# Patient Record
Sex: Male | Born: 1960 | Race: White | Hispanic: No | Marital: Married | Smoking: Never smoker
Health system: Southern US, Community
[De-identification: ages and names within clinical notes are randomized; demographics above are authoritative.]

## PROBLEM LIST (undated history)

## (undated) DIAGNOSIS — I10 Essential (primary) hypertension: Secondary | ICD-10-CM

## (undated) DIAGNOSIS — I493 Ventricular premature depolarization: Secondary | ICD-10-CM

## (undated) DIAGNOSIS — I471 Supraventricular tachycardia: Secondary | ICD-10-CM

## (undated) HISTORY — PX: NO PAST SURGERIES: SHX2092

## (undated) HISTORY — DX: Ventricular premature depolarization: I49.3

## (undated) HISTORY — DX: Supraventricular tachycardia: I47.1

## (undated) HISTORY — PX: KNEE CARTILAGE SURGERY: SHX688

---

## 2007-04-03 ENCOUNTER — Ambulatory Visit (HOSPITAL_COMMUNITY): Admission: RE | Admit: 2007-04-03 | Discharge: 2007-04-03 | Payer: Self-pay | Admitting: Chiropractic Medicine

## 2009-08-20 DEATH — deceased

## 2016-06-18 ENCOUNTER — Ambulatory Visit (HOSPITAL_COMMUNITY): Payer: BC Managed Care – PPO

## 2016-06-18 ENCOUNTER — Encounter (HOSPITAL_COMMUNITY): Payer: Self-pay | Admitting: *Deleted

## 2016-06-18 ENCOUNTER — Ambulatory Visit (HOSPITAL_COMMUNITY)
Admission: EM | Admit: 2016-06-18 | Discharge: 2016-06-18 | Disposition: A | Discharge status: Home / Self Care | Payer: BC Managed Care – PPO | Attending: Emergency Medicine | Admitting: Emergency Medicine

## 2016-06-18 DIAGNOSIS — M25562 Pain in left knee: Secondary | ICD-10-CM

## 2016-06-18 DIAGNOSIS — W19XXXA Unspecified fall, initial encounter: Secondary | ICD-10-CM | POA: Diagnosis not present

## 2016-06-18 DIAGNOSIS — M7989 Other specified soft tissue disorders: Secondary | ICD-10-CM | POA: Diagnosis not present

## 2016-06-18 DIAGNOSIS — M170 Bilateral primary osteoarthritis of knee: Secondary | ICD-10-CM | POA: Diagnosis not present

## 2016-06-18 DIAGNOSIS — Z79899 Other long term (current) drug therapy: Secondary | ICD-10-CM | POA: Diagnosis not present

## 2016-06-18 DIAGNOSIS — Z7982 Long term (current) use of aspirin: Secondary | ICD-10-CM | POA: Insufficient documentation

## 2016-06-18 DIAGNOSIS — M25462 Effusion, left knee: Secondary | ICD-10-CM | POA: Diagnosis not present

## 2016-06-18 DIAGNOSIS — I10 Essential (primary) hypertension: Secondary | ICD-10-CM | POA: Insufficient documentation

## 2016-06-18 HISTORY — DX: Essential (primary) hypertension: I10

## 2016-06-18 MED ORDER — PREDNISONE 50 MG PO TABS: ORAL_TABLET | ORAL | 0 refills | Status: DC

## 2016-06-18 NOTE — ED Triage Notes (Signed)
Patient reports slipping on Monday and hitting left side, reports injury to left hand, left wrist, and left knee. Patient states he is still having pain to left knee with ambulation, relieved with rest. Patient getting gown on to examine knee further.

## 2016-06-18 NOTE — Discharge Instructions (Signed)
I suspect you have a small tear in your meniscus. Take prednisone daily for 5 days. Wear the knee sleeve when you are moving around for the next 5 days. Apply ice at least 3 times a day for 20 minutes.  He is also jammed her finger. Once the swelling goes down, put a little traction on the end of the finger to unjam it.  Follow up as needed.

## 2016-06-18 NOTE — ED Provider Notes (Signed)
[facility]    CSN: [id number] Arrival date & time: 06/18/16  1223  First Provider Contact:  First MD Initiated Contact with Patient 06/18/16 1425        History   Chief Complaint Chief Complaint  Patient presents with  . Fall  . Knee Pain    HPI Christopher Boone is a 55 y.o. male.   He is a 55 year old man here with his wife for evaluation of left knee pain after fall. He states he fell at work 6 days ago. He describes a complex fall that involved twisting and jumping to get out of the way of an oncoming vehicle. He reports landing on his left wrist and left knee. He denies any persistent pain in the left hand or wrist. He does state he cannot extend his fourth finger DIP joint actively.  He reports continued pain in the lateral aspect of the left knee. He has been able to weight-bear without too much difficulty. He denies any significant swelling.    Past Medical History:  Diagnosis Date  . Hypertension     There are no active problems to display for this patient.   History reviewed. No pertinent surgical history.     Home Medications    Prior to Admission medications   Medication Sig Start Date End Date Taking? Authorizing Provider  aspirin 81 MG tablet Take 81 mg by mouth daily.   Yes Historical Provider, MD  metoprolol succinate (TOPROL-XL) 25 MG 24 hr tablet Take 25 mg by mouth daily.   Yes Historical Provider, MD  predniSONE (DELTASONE) 50 MG tablet Take 1 pill daily for 5 days. 06/18/16   Charm Rings, MD    Family History History reviewed. No pertinent family history.  Social History Social History  Substance Use Topics  . Smoking status: Never Smoker  . Smokeless tobacco: Never Used  . Alcohol use No     Allergies   Review of patient's allergies indicates no known allergies.   Review of Systems Review of Systems  Musculoskeletal:       Left ring finger issue and left knee pain     Physical Exam Triage Vital Signs ED Triage  Vitals  Enc Vitals Group     BP 06/18/16 1355 138/78     Pulse Rate 06/18/16 1355 78     Resp 06/18/16 1355 17     Temp 06/18/16 1355 98.7 F (37.1 C)     Temp Source 06/18/16 1355 Oral     SpO2 06/18/16 1355 98 %     Weight --      Height --      Head Circumference --      Peak Flow --      Pain Score 06/18/16 1358 4     Pain Loc --      Pain Edu? --      Excl. in GC? --    No data found.   Updated Vital Signs BP 138/78 (BP Location: Right Arm)   Pulse 78   Temp 98.7 F (37.1 C) (Oral)   Resp 17   SpO2 98%   Visual Acuity Right Eye Distance:   Left Eye Distance:   Bilateral Distance:    Right Eye Near:   Left Eye Near:    Bilateral Near:     Physical Exam  Constitutional: He is oriented to person, place, and time. He appears well-developed and well-nourished. No distress.  Cardiovascular: Normal rate.   Pulmonary/Chest: Effort normal.  Musculoskeletal:  Left hand: Healing scrapes on the fourth and fifth digits by the nail. Fourth finger DIP is able to be passively extended, but unable to be held in full extension. Left knee: No swelling or erythema.  Tender over lateral joint line.  Positive mcmurrays.    Neurological: He is alert and oriented to person, place, and time.     UC Treatments / Results  Labs (all labs ordered are listed, but only abnormal results are displayed) Labs Reviewed - No data to display  EKG  EKG Interpretation None       Radiology Dg Knee Complete 4 Views Left  Result Date: 06/18/2016 CLINICAL DATA:  Fall, knee injury. EXAM: LEFT KNEE - COMPLETE 4+ VIEW COMPARISON:  None. FINDINGS: Degenerative changes in the left knee with joint space narrowing and spurring, most pronounced in the patellofemoral compartment. Small joint effusion. No acute bony abnormality. Specifically, no fracture, subluxation, or dislocation. Soft tissues are intact. IMPRESSION: Mild degenerative changes with small joint effusion. No acute findings.  Electronically Signed   By: Charlett Nose M.D.   On: 06/18/2016 15:21  Dg Finger Ring Left  Result Date: 06/18/2016 CLINICAL DATA:  Pain status post fall EXAM: LEFT RING FINGER 2+V COMPARISON:  None. FINDINGS: No evidence of fracture. There is a possible slight posterior dislocation at the fourth DIP joint. Associated soft tissue swelling. Osseous mineralization is normal. IMPRESSION: No evidence of fracture. Possible slight posterior dislocation at the fourth DIP joint. Electronically Signed   By: Ted Mcalpine M.D.   On: 06/18/2016 15:23   Procedures Procedures (including critical care time)  Medications Ordered in UC Medications - No data to display   Initial Impression / Assessment and Plan / UC Course  I have reviewed the triage vital signs and the nursing notes.  Pertinent labs & imaging results that were available during my care of the patient were reviewed by me and considered in my medical decision making (see chart for details).  Clinical Course    The pain is likely due to a small meniscal tear. Offered injection, but patient declined. Will treat with ice, knee sleeves, and 5 days of prednisone. He is receiving injections for bilateral knee arthritis.  Left finger is jammed. Expect improvement once the swelling goes down.  Follow-up as needed.  Final Clinical Impressions(s) / UC Diagnoses   Final diagnoses:  Left knee pain    New Prescriptions New Prescriptions   PREDNISONE (DELTASONE) 50 MG TABLET    Take 1 pill daily for 5 days.     Charm Rings, MD 06/18/16 [phone]

## 2016-06-18 NOTE — ED Notes (Signed)
Patient requested to put knee sleeve on once they got home.

## 2017-08-07 IMAGING — DX DG FINGER RING 2+V*L*
3 series · 3 of 3 positions shown · non-contrast
Comparison: None.

CLINICAL DATA: Pain status post fall

EXAM:
LEFT RING FINGER 2+V

[x finger pa left]
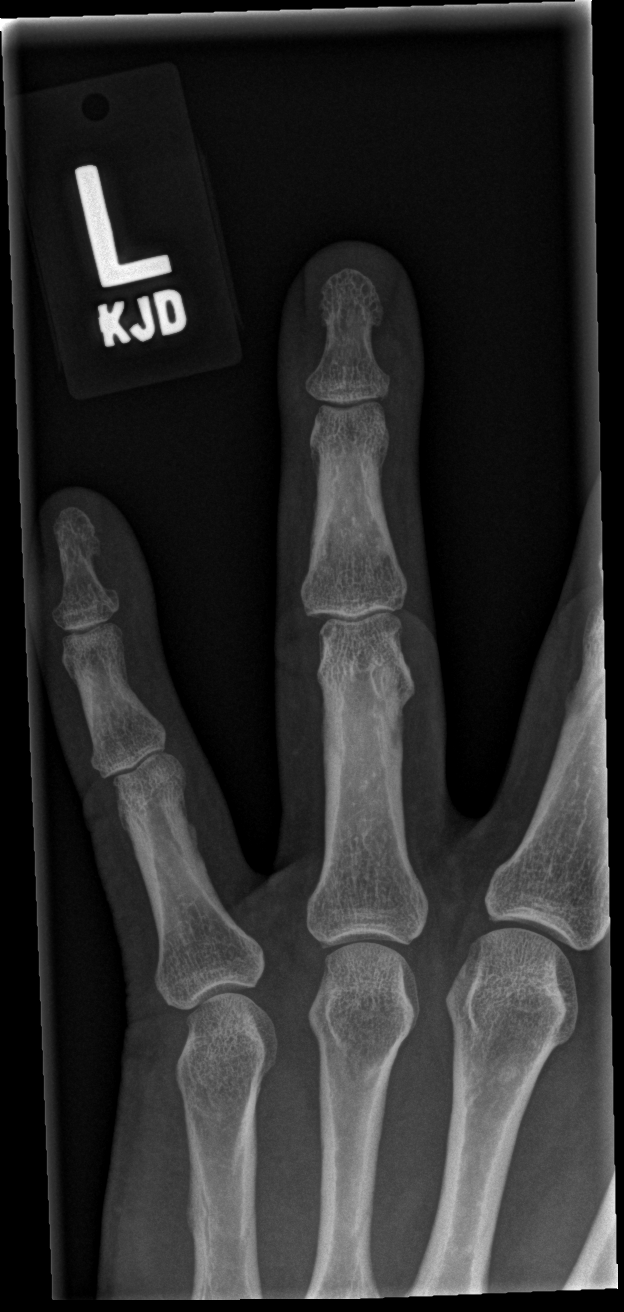

[x finger obl left]
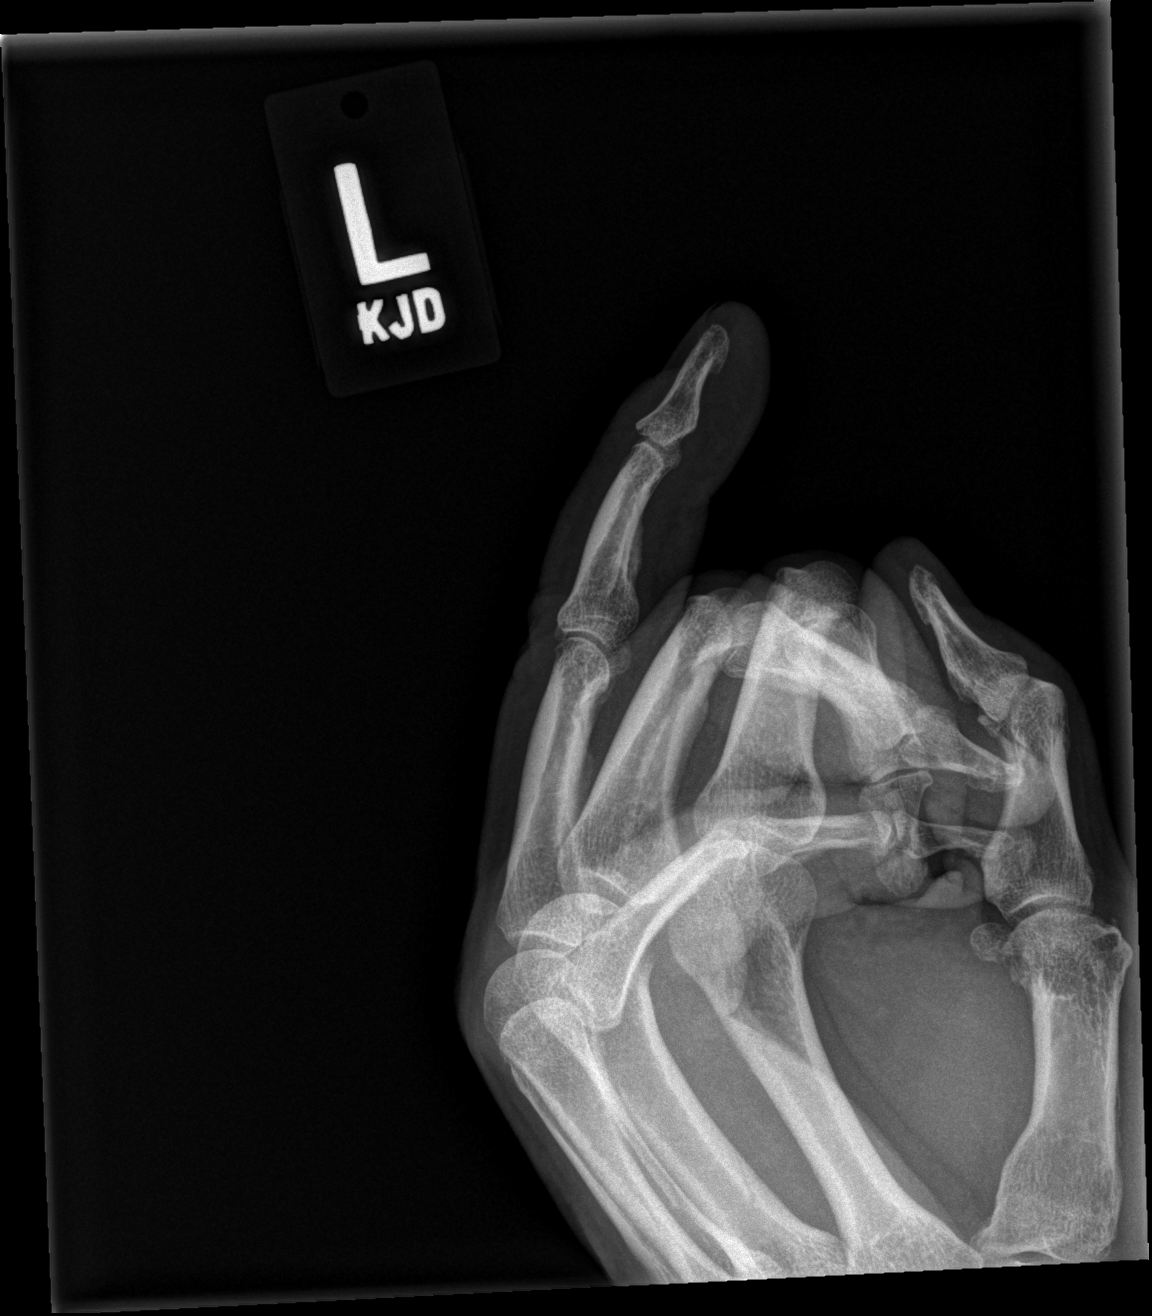

[x finger lat left]
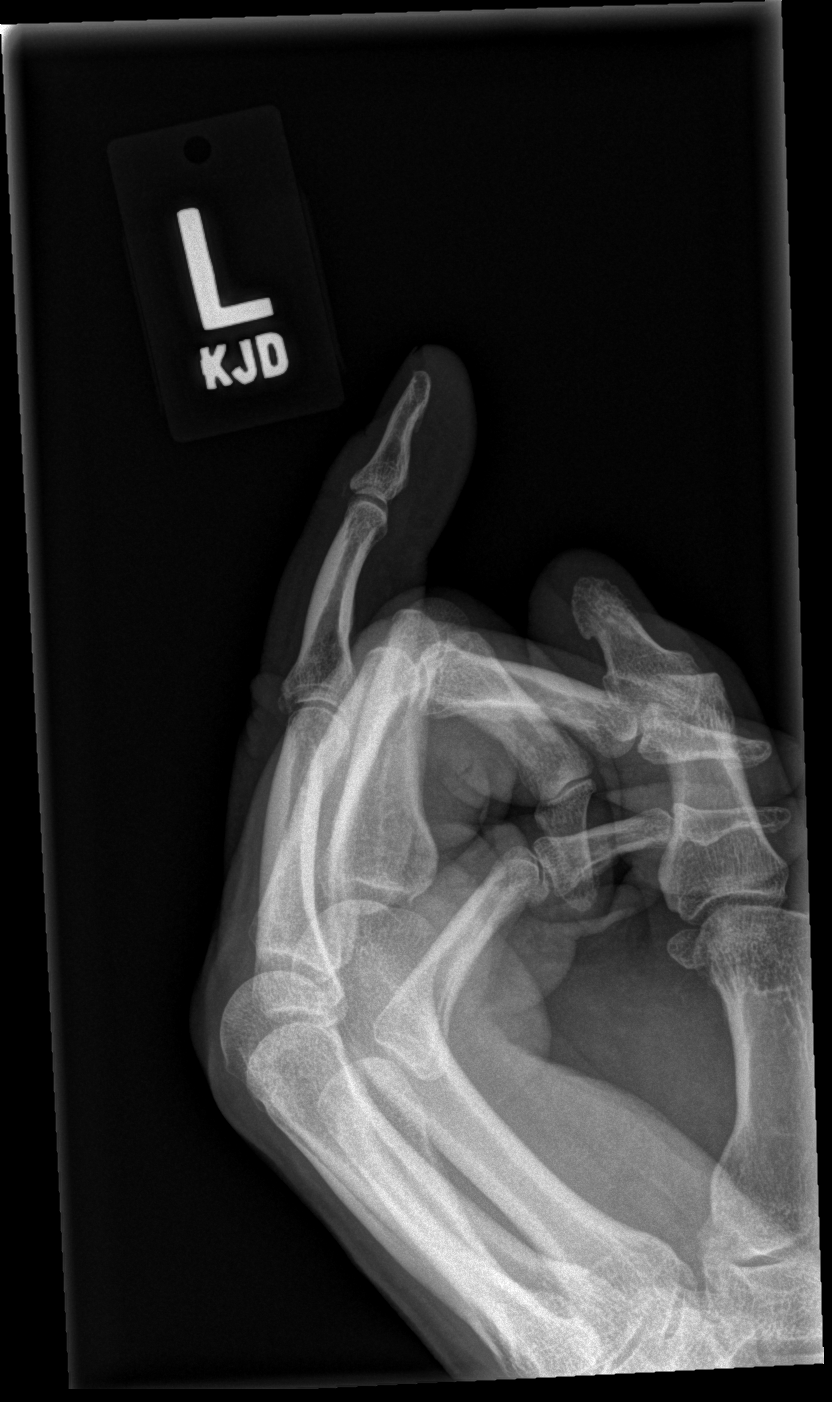

[3 of 3 positions shown; findings below may reference images not displayed]

FINDINGS: No evidence of fracture. There is a possible slight posterior
dislocation at the fourth DIP joint. Associated soft tissue
swelling. Osseous mineralization is normal.
IMPRESSION: No evidence of fracture.

Possible slight posterior dislocation at the fourth DIP joint.

## 2018-04-29 ENCOUNTER — Telehealth: Payer: Self-pay | Admitting: Cardiology

## 2018-04-29 MED ORDER — METOPROLOL SUCCINATE ER 25 MG PO TB24: 12.5000 mg | ORAL_TABLET | Freq: Every day | ORAL | 0 refills | Status: DC

## 2018-04-29 MED ORDER — METOPROLOL SUCCINATE ER 25 MG PO TB24: 50.0000 mg | ORAL_TABLET | Freq: Every day | ORAL | 0 refills | Status: DC

## 2018-04-29 NOTE — Telephone Encounter (Signed)
Patient needs a refill of metoprolol, wants to wait until [place] to be seen.

## 2018-04-29 NOTE — Addendum Note (Signed)
Addended by: Lamona CurlOUTH, Izear Pine H on: 04/29/2018 03:30 PM   Modules accepted: Orders

## 2018-04-29 NOTE — Telephone Encounter (Signed)
Patients wife called requesting that Rx be resent into pharmacy as patient only takes 0.5 tablet daily.  Rx resent to pharmacy.

## 2018-04-29 NOTE — Telephone Encounter (Signed)
Contacted Christopher Boone and advised that appointment would need to be made for Christopher Boone office visit in order for refill to be sent. Appointment made for 05/21/18 at 4 pm. Advised to arrive 15-20 minutes early with a current medication list.  Christopher KidneyDebra states the Toprol XL must be sent in as brand name. Patient currently takes 25 mg tablet and cuts in half for 12.5 mg daily. Advised that this medication typically does not get cut in half as it is a long acting medication and cutting it disrupts the long acting properties. Christopher KidneyDebra states patient has been taking the medication this way per [name] for years. Advised Christopher KidneyDebra would send in 30 day supply this way and could address with [name] at office follow-up. Advised no refills would be sent at this time other than one for 30 days and that patient will need to keep follow-up for future refills. Christopher Boone verbalized understanding. No further questions.

## 2018-05-08 DIAGNOSIS — I471 Supraventricular tachycardia, unspecified: Secondary | ICD-10-CM

## 2018-05-08 HISTORY — DX: Supraventricular tachycardia: I47.1

## 2018-05-08 HISTORY — DX: Supraventricular tachycardia, unspecified: I47.10

## 2018-05-10 ENCOUNTER — Other Ambulatory Visit (INDEPENDENT_AMBULATORY_CARE_PROVIDER_SITE_OTHER): Payer: Self-pay | Admitting: Physician Assistant

## 2018-05-10 ENCOUNTER — Ambulatory Visit
Admission: RE | Admit: 2018-05-10 | Discharge: 2018-05-10 | Disposition: A | Discharge status: Home / Self Care | Payer: BC Managed Care – PPO | Source: Ambulatory Visit | Attending: Physician Assistant | Admitting: Physician Assistant

## 2018-05-10 DIAGNOSIS — M5440 Lumbago with sciatica, unspecified side: Secondary | ICD-10-CM

## 2018-05-10 DIAGNOSIS — M25561 Pain in right knee: Secondary | ICD-10-CM

## 2018-05-10 DIAGNOSIS — M542 Cervicalgia: Secondary | ICD-10-CM

## 2018-05-21 ENCOUNTER — Encounter: Payer: Self-pay | Admitting: Cardiology

## 2018-05-21 ENCOUNTER — Ambulatory Visit: Payer: BC Managed Care – PPO | Admitting: Cardiology

## 2018-05-21 VITALS — BP 118/64 | HR 89 | Ht 75.0 in | Wt 268.8 lb

## 2018-05-21 DIAGNOSIS — I493 Ventricular premature depolarization: Secondary | ICD-10-CM | POA: Diagnosis not present

## 2018-05-21 DIAGNOSIS — I471 Supraventricular tachycardia: Secondary | ICD-10-CM | POA: Diagnosis not present

## 2018-05-21 MED ORDER — METOPROLOL SUCCINATE ER 25 MG PO TB24: 12.5000 mg | ORAL_TABLET | Freq: Every day | ORAL | 0 refills | Status: DC

## 2018-05-21 NOTE — Progress Notes (Signed)
Cardiology Office Note:    Date:  05/21/2018   ID:  Christopher Boone, DOB 04-Jun-1961, MRN [id number]  PCP:  Patient, No Pcp Per  Cardiologist:  Shirlee More, MD    Referring MD: No ref. provider found    ASSESSMENT:    1. SVT (supraventricular tachycardia) ([place])   2. PVC (premature ventricular contraction)    PLAN:    In order of problems listed above:  1. Remain on his beta-blocker as he has had a good clinical response with no significant arrhythmia and check electrolytes liver function and lipid profile at his request as he is overdue for wellness examination 2. Symptomatic PVCs are stable continue beta-blocker   Next appointment: One year   Medication Adjustments/Labs and Tests Ordered: Current medicines are reviewed at length with the patient today.  Concerns regarding medicines are outlined above.  Orders Placed This Encounter  Procedures  . Comp Met (CMET)  . Lipid Profile  . EKG 12-Lead   Meds ordered this encounter  Medications  . metoprolol succinate (TOPROL-XL) 25 MG 24 hr tablet    Sig: Take 0.5 tablets (12.5 mg total) by mouth daily.    Dispense:  15 tablet    Refill:  0    Patient must take brand name.    Chief Complaint  Patient presents with  . Follow-up    for SVT    History of Present Illness:    Christopher Boone is a 57 y.o. male with a hx of SVT last seen 06/126/17. Compliance with diet, lifestyle and medications: Yes  He continues on  minimum dose of beta-blocker and unless he misses doses is unaware of his heart beating.  He avoids over-the-counter proarrhythmic drugs no chest pain palpitation or syncope.  Weight is up 20 pounds counseled him for wellness check lipid profile also check electrolytes and liver function with beta-blocker therapy and arrhythmia. Past Medical History:  Diagnosis Date  . Hypertension   . SVT (supraventricular tachycardia) ([place]) 05/08/2018    Past Surgical History:  Procedure Laterality Date  . NO PAST SURGERIES        Current Medications: Current Meds  Medication Sig  . aspirin 81 MG tablet Take 81 mg by mouth daily.  . metoprolol succinate (TOPROL-XL) 25 MG 24 hr tablet Take 0.5 tablets (12.5 mg total) by mouth daily.  . [DISCONTINUED] metoprolol succinate (TOPROL-XL) 25 MG 24 hr tablet Take 0.5 tablets (12.5 mg total) by mouth daily.     Allergies:   Patient has no known allergies.   Social History   Socioeconomic History  . Marital status: Unknown    Spouse name: Not on file  . Number of children: Not on file  . Years of education: Not on file  . Highest education level: Not on file  Occupational History  . Not on file  Social Needs  . Financial resource strain: Not on file  . Food insecurity:    Worry: Not on file    Inability: Not on file  . Transportation needs:    Medical: Not on file    Non-medical: Not on file  Tobacco Use  . Smoking status: Never Smoker  . Smokeless tobacco: Never Used  Substance and Sexual Activity  . Alcohol use: No  . Drug use: Never  . Sexual activity: Not on file  Lifestyle  . Physical activity:    Days per week: Not on file    Minutes per session: Not on file  . Stress: Not on file  Relationships  . Social connections:    Talks on phone: Not on file    Gets together: Not on file    Attends religious service: Not on file    Active member of club or organization: Not on file    Attends meetings of clubs or organizations: Not on file    Relationship status: Not on file  Other Topics Concern  . Not on file  Social History Narrative  . Not on file     Family History: The patient's family history includes Arthritis in his father; Cervical cancer in his mother. ROS:   Please see the history of present illness.    All other systems reviewed and are negative.  EKGs/Labs/Other Studies Reviewed:    The following studies were reviewed today:  EKG:  EKG ordered today.  The ekg ordered today demonstrates sinus rhythm one single PVC  Recent  Labs: No results found for requested labs within last 8760 hours.  Recent Lipid Panel No results found for: CHOL, TRIG, HDL, CHOLHDL, VLDL, LDLCALC, LDLDIRECT  Physical Exam:    VS:  BP 118/64   Pulse 89   Ht _0  (1.905 m)   Wt 268 lb 12.8 oz (121.9 kg)   SpO2 97%   BMI 33.60 kg/m     Wt Readings from Last 3 Encounters:  05/21/18 268 lb 12.8 oz (121.9 kg)     GEN:  Well nourished, well developed in no acute distress HEENT: Normal NECK: No JVD; No carotid bruits LYMPHATICS: No lymphadenopathy CARDIAC: RRR, no murmurs, rubs, gallops RESPIRATORY:  Clear to auscultation without rales, wheezing or rhonchi  ABDOMEN: Soft, non-tender, non-distended MUSCULOSKELETAL:  No edema; No deformity  SKIN: Warm and dry NEUROLOGIC:  Alert and oriented x 3 PSYCHIATRIC:  Normal affect    Signed, Shirlee More, MD  05/21/2018 4:45 PM    [facility]

## 2018-05-21 NOTE — Patient Instructions (Addendum)
Medication Instructions:  Your physician recommends that you continue on your current medications as directed. Please refer to the Current Medication list given to you today.   Labwork: Your physician recommends that you have the following labs drawn: CMP and lipids.  Testing/Procedures: NONE  Follow-Up: Your physician wants you to follow-up in: 1 year.  You will receive a reminder letter in the mail two months in advance. If you don't receive a letter, please call our office to schedule the follow-up appointment.   Any Other Special Instructions Will Be Listed Below (If Applicable).     If you need a refill on your cardiac medications before your next appointment, please call your pharmacy.   1. Avoid all over-the-counter antihistamines except Claritin/Loratadine and Zyrtec/Cetrizine. 2. Avoid all combination including cold sinus allergies flu decongestant and sleep medications 3. You can use Robitussin DM Mucinex and Mucinex DM for cough. 4. can use Tylenol aspirin ibuprofen and naproxen but no combinations such as sleep or sinus.

## 2018-05-22 LAB — COMPREHENSIVE METABOLIC PANEL
A/G RATIO: 1.9 (ref 1.2–2.2)
ALBUMIN: 4.4 g/dL (ref 3.5–5.5)
ALT: 33 IU/L (ref 0–44)
AST: 18 IU/L (ref 0–40)
Alkaline Phosphatase: 90 IU/L (ref 39–117)
BILIRUBIN TOTAL: 0.3 mg/dL (ref 0.0–1.2)
BUN / CREAT RATIO: 17 (ref 9–20)
BUN: 16 mg/dL (ref 6–24)
CHLORIDE: 104 mmol/L (ref 96–106)
CO2: 22 mmol/L (ref 20–29)
Calcium: 8.8 mg/dL (ref 8.7–10.2)
Creatinine, Ser: 0.92 mg/dL (ref 0.76–1.27)
GFR calc Af Amer: 106 mL/min/{1.73_m2} (ref >59)
GFR calc non Af Amer: 91 mL/min/{1.73_m2} (ref >59)
Globulin, Total: 2.3 g/dL (ref 1.5–4.5)
Glucose: 87 mg/dL (ref 65–99)
POTASSIUM: 4.7 mmol/L (ref 3.5–5.2)
Sodium: 138 mmol/L (ref 134–144)
TOTAL PROTEIN: 6.7 g/dL (ref 6.0–8.5)

## 2018-05-22 LAB — LIPID PANEL
Chol/HDL Ratio: 4.7 ratio (ref 0.0–5.0)
Cholesterol, Total: 179 mg/dL (ref 100–199)
HDL: 38 mg/dL — ABNORMAL LOW (ref >39)
LDL Calculated: 110 mg/dL — ABNORMAL HIGH (ref 0–99)
Triglycerides: 155 mg/dL — ABNORMAL HIGH (ref 0–149)
VLDL Cholesterol Cal: 31 mg/dL (ref 5–40)

## 2018-06-20 ENCOUNTER — Other Ambulatory Visit: Payer: Self-pay

## 2018-06-20 MED ORDER — METOPROLOL SUCCINATE ER 25 MG PO TB24: 12.5000 mg | ORAL_TABLET | Freq: Every day | ORAL | 2 refills | Status: DC

## 2018-06-21 ENCOUNTER — Telehealth: Payer: Self-pay | Admitting: Cardiology

## 2018-06-21 ENCOUNTER — Other Ambulatory Visit: Payer: Self-pay

## 2018-06-21 MED ORDER — TOPROL XL 25 MG PO TB24: 12.5000 mg | ORAL_TABLET | Freq: Every day | ORAL | 2 refills | Status: DC

## 2018-06-21 NOTE — Telephone Encounter (Signed)
States pt is supposed to be on Toprol XL-not metoprolol but metoprolol was sent in for him yesterday

## 2018-06-24 ENCOUNTER — Other Ambulatory Visit: Payer: Self-pay

## 2018-06-24 NOTE — Telephone Encounter (Signed)
Refill for name brand medication was sent on Friday.

## 2018-08-22 DIAGNOSIS — Z23 Encounter for immunization: Secondary | ICD-10-CM

## 2018-08-22 DIAGNOSIS — H6690 Otitis media, unspecified, unspecified ear: Secondary | ICD-10-CM | POA: Insufficient documentation

## 2018-08-22 HISTORY — DX: Otitis media, unspecified, unspecified ear: H66.90

## 2018-08-22 HISTORY — DX: Encounter for immunization: Z23

## 2019-03-10 ENCOUNTER — Telehealth: Payer: Self-pay | Admitting: Cardiology

## 2019-03-10 NOTE — Telephone Encounter (Signed)
Paper came from [facility] regarding Christopher Boone [birth year] 81mg  aspirin daily. Patient having orthopedic stem cell procedure in April and wanting to know when to discontinue and resume apsirin. I also copied to your email.

## 2019-03-11 NOTE — Telephone Encounter (Signed)
Yes can hold aspirin please have them tell him when to resume.

## 2019-03-11 NOTE — Telephone Encounter (Signed)
Christopher Mainland, PA wants to know if you are in agreement with patient hold aspirin 81 mg for one week prior to his upcoming bio-restorative orthopedic stem cell procedure and resuming aspirin 2 weeks after the procedure is completed. If you do not agree with this, please provide alternative instructions regarding aspirin and I will relay this information to [place], [place]. Thanks!   I have a copy of the actual paper in my email if you would like to review it. If so, let me know and I will email it to you.

## 2019-03-11 NOTE — Telephone Encounter (Signed)
Mardi Mainland, PA informed that Dr. Dulce Sellar is in agreement that patient can stop aspirin 81 mg for one week before upcoming procedure. Informed Elissa, PA that her office is to decide when patient can resume aspirin afterwards. Mardi Mainland, PA verbalized understanding. No further questions.

## 2019-07-14 ENCOUNTER — Other Ambulatory Visit: Payer: Self-pay | Admitting: Cardiology

## 2019-10-14 NOTE — Progress Notes (Signed)
Cardiology Office Note:    Date:  10/15/2019   ID:  Rinaldo Ratel, DOB 1961-07-28, MRN [id number]  PCP:  Patient, No Pcp Per  Cardiologist:  Shirlee More, MD    Referring MD: No ref. provider found    ASSESSMENT:    1. SVT (supraventricular tachycardia) ([place])   2. Oral herpes    PLAN:    In order of problems listed above:  1. Well suppressed BP at target continue Toprol-XL follow-up in 1 year he asked me to do a lipid profile will check a CMP. 2. If he can call with dose and frequency I can give him a refill during COVID-19 to avoid medical interactions if possible   Next appointment: 1 year   Medication Adjustments/Labs and Tests Ordered: Current medicines are reviewed at length with the patient today.  Concerns regarding medicines are outlined above.  No orders of the defined types were placed in this encounter.  No orders of the defined types were placed in this encounter.   Chief Complaint  Patient presents with  . Follow-up    for SVT    History of Present Illness:    Mylon Mabey is a 58 y.o. male with a hx of SVT suppressed with a minimum dose of beta-blocker.  Last seen 05/21/2018.  After his visit labs were drawn putting a favorable lipid profile cholesterol 179 LDL 110 HDL 38 normal CMP Compliance with diet, lifestyle and medications: Yes  Is had no recurrent episodes of tachyarrhythmia and tolerates his beta-blocker Home blood pressure once consistently was normal less than 120/80 is checked by his wife who is an Therapist, sports.  No edema chest pain palpitation or syncope.  He asked me for refill of prescription of acyclovir for oral herpes and I told him he need to call back to the office with the dose and frequency. Past Medical History:  Diagnosis Date  . Hypertension   . SVT (supraventricular tachycardia) ([place]) 05/08/2018    Past Surgical History:  Procedure Laterality Date  . KNEE CARTILAGE SURGERY      Current Medications: Current Meds  Medication Sig   . aspirin 81 MG tablet Take 81 mg by mouth daily.  . TOPROL XL 25 MG 24 hr tablet TAKE 1/2 TABLET BY MOUTH DAILY     Allergies:   Patient has no known allergies.   Social History   Socioeconomic History  . Marital status: Unknown    Spouse name: Not on file  . Number of children: Not on file  . Years of education: Not on file  . Highest education level: Not on file  Occupational History  . Not on file  Social Needs  . Financial resource strain: Not on file  . Food insecurity    Worry: Not on file    Inability: Not on file  . Transportation needs    Medical: Not on file    Non-medical: Not on file  Tobacco Use  . Smoking status: Never Smoker  . Smokeless tobacco: Never Used  Substance and Sexual Activity  . Alcohol use: No  . Drug use: Never  . Sexual activity: Not on file  Lifestyle  . Physical activity    Days per week: Not on file    Minutes per session: Not on file  . Stress: Not on file  Relationships  . Social Herbalist on phone: Not on file    Gets together: Not on file    Attends religious service: Not on  file    Active member of club or organization: Not on file    Attends meetings of clubs or organizations: Not on file    Relationship status: Not on file  Other Topics Concern  . Not on file  Social History Narrative  . Not on file     Family History: The patient's family history includes Arthritis in his father; Cervical cancer in his mother. ROS:   Please see the history of present illness.    All other systems reviewed and are negative.  EKGs/Labs/Other Studies Reviewed:    The following studies were reviewed today:  EKG:  EKG ordered today and personally reviewed.  The ekg ordered today demonstrates sinus rhythm normal  Recent Labs: No results found for requested labs within last 8760 hours.  Recent Lipid Panel    Component Value Date/Time   CHOL 179 05/21/2018 1659   TRIG 155 (H) 05/21/2018 1659   HDL 38 (L) 05/21/2018 1659    CHOLHDL 4.7 05/21/2018 1659   LDLCALC 110 (H) 05/21/2018 1659    Physical Exam:    VS:  BP (!) 132/94 (BP Location: Right Arm, Patient Position: Sitting, Cuff Size: Large)   Pulse 89   Ht 6\' 3"  (1.905 m)   Wt 275 lb (124.7 kg)   SpO2 97%   BMI 34.37 kg/m     Wt Readings from Last 3 Encounters:  10/15/19 275 lb (124.7 kg)  05/21/18 268 lb 12.8 oz (121.9 kg)     GEN:  Well nourished, well developed in no acute distress HEENT: Normal NECK: No JVD; No carotid bruits LYMPHATICS: No lymphadenopathy CARDIAC: RRR, no murmurs, rubs, gallops RESPIRATORY:  Clear to auscultation without rales, wheezing or rhonchi  ABDOMEN: Soft, non-tender, non-distended MUSCULOSKELETAL:  No edema; No deformity  SKIN: Warm and dry NEUROLOGIC:  Alert and oriented x 3 PSYCHIATRIC:  Normal affect    Signed, 07/22/18, MD  10/15/2019 8:53 AM    [facility]

## 2019-10-15 ENCOUNTER — Ambulatory Visit (INDEPENDENT_AMBULATORY_CARE_PROVIDER_SITE_OTHER): Payer: BC Managed Care – PPO | Admitting: Cardiology

## 2019-10-15 ENCOUNTER — Other Ambulatory Visit: Payer: Self-pay

## 2019-10-15 ENCOUNTER — Encounter: Payer: Self-pay | Admitting: Cardiology

## 2019-10-15 ENCOUNTER — Telehealth: Payer: Self-pay | Admitting: Cardiology

## 2019-10-15 VITALS — BP 132/94 | HR 89 | Ht 75.0 in | Wt 275.0 lb

## 2019-10-15 DIAGNOSIS — Z13220 Encounter for screening for lipoid disorders: Secondary | ICD-10-CM | POA: Diagnosis not present

## 2019-10-15 DIAGNOSIS — I471 Supraventricular tachycardia: Secondary | ICD-10-CM

## 2019-10-15 DIAGNOSIS — B002 Herpesviral gingivostomatitis and pharyngotonsillitis: Secondary | ICD-10-CM | POA: Diagnosis not present

## 2019-10-15 MED ORDER — VALACYCLOVIR HCL 500 MG PO TABS: 500.0000 mg | ORAL_TABLET | Freq: Two times a day (BID) | ORAL | 1 refills | Status: DC | PRN

## 2019-10-15 MED ORDER — TOPROL XL 25 MG PO TB24: 12.5000 mg | ORAL_TABLET | Freq: Every day | ORAL | 3 refills | Status: DC

## 2019-10-15 NOTE — Telephone Encounter (Signed)
Christopher Boone from [place] needs a call regarding script

## 2019-10-15 NOTE — Addendum Note (Signed)
Addended by: Austin Miles on: 10/15/2019 09:08 AM   Modules accepted: Orders

## 2019-10-15 NOTE — Telephone Encounter (Signed)
Called [facility] back and spoke with a representative who wanted to know if patient's metoprolol succinate needed to be filled as brand name or if it could be filled as the generic. Advised that the medication could be filled either way. The reason the medication was sent in as brand name is because that is how the patient has had it filled in the past. No further questions.

## 2019-10-15 NOTE — Telephone Encounter (Signed)
Prescription has been sent to [facility] in [place] as requested per Dr. Bettina Gavia.

## 2019-10-15 NOTE — Patient Instructions (Signed)
Medication Instructions:  Your physician recommends that you continue on your current medications as directed. Please refer to the Current Medication list given to you today.  **Call our office with the dosage and frequency of valtrex and Dr. Bettina Gavia will fill this medication.   *If you need a refill on your cardiac medications before your next appointment, please call your pharmacy*  Lab Work: Your physician recommends that you return for lab work today: CMP, lipid panel.   If you have labs (blood work) drawn today and your tests are completely normal, you will receive your results only by: Marland Kitchen MyChart Message (if you have MyChart) OR . A paper copy in the mail If you have any lab test that is abnormal or we need to change your treatment, we will call you to review the results.  Testing/Procedures: You had an EKG today.   Follow-Up: At [facility], you and your health needs are our priority.  As part of our continuing mission to provide you with exceptional heart care, we have created designated Provider Care Teams.  These Care Teams include your primary Cardiologist (physician) and Advanced Practice Providers (APPs -  Physician Assistants and Nurse Practitioners) who all work together to provide you with the care you need, when you need it.  Your next appointment:   1 year(s)  The format for your next appointment:   In Person  Provider:   Shirlee More, MD

## 2019-10-15 NOTE — Telephone Encounter (Signed)
Valtrex script for blisters Take 500mg  twice a day

## 2019-10-16 LAB — LIPID PANEL
Chol/HDL Ratio: 5 ratio (ref 0.0–5.0)
Cholesterol, Total: 164 mg/dL (ref 100–199)
HDL: 33 mg/dL — ABNORMAL LOW (ref >39)
LDL Chol Calc (NIH): 114 mg/dL — ABNORMAL HIGH (ref 0–99)
Triglycerides: 89 mg/dL (ref 0–149)
VLDL Cholesterol Cal: 17 mg/dL (ref 5–40)

## 2019-10-16 LAB — COMPREHENSIVE METABOLIC PANEL
ALT: 27 IU/L (ref 0–44)
AST: 23 IU/L (ref 0–40)
Albumin/Globulin Ratio: 1.8 (ref 1.2–2.2)
Albumin: 4.4 g/dL (ref 3.8–4.9)
Alkaline Phosphatase: 79 IU/L (ref 39–117)
BUN/Creatinine Ratio: 19 (ref 9–20)
BUN: 15 mg/dL (ref 6–24)
Bilirubin Total: 0.5 mg/dL (ref 0.0–1.2)
CO2: 18 mmol/L — ABNORMAL LOW (ref 20–29)
Calcium: 8.6 mg/dL — ABNORMAL LOW (ref 8.7–10.2)
Chloride: 104 mmol/L (ref 96–106)
Creatinine, Ser: 0.79 mg/dL (ref 0.76–1.27)
GFR calc Af Amer: 114 mL/min/{1.73_m2} (ref >59)
GFR calc non Af Amer: 98 mL/min/{1.73_m2} (ref >59)
Globulin, Total: 2.4 g/dL (ref 1.5–4.5)
Glucose: 94 mg/dL (ref 65–99)
Potassium: 4.7 mmol/L (ref 3.5–5.2)
Sodium: 138 mmol/L (ref 134–144)
Total Protein: 6.8 g/dL (ref 6.0–8.5)

## 2020-01-16 ENCOUNTER — Other Ambulatory Visit: Payer: Self-pay | Admitting: Cardiology

## 2020-03-21 ENCOUNTER — Other Ambulatory Visit: Payer: Self-pay | Admitting: Cardiology

## 2020-06-21 ENCOUNTER — Other Ambulatory Visit: Payer: Self-pay

## 2020-06-21 ENCOUNTER — Ambulatory Visit: Payer: BC Managed Care – PPO | Admitting: Cardiology

## 2020-06-21 ENCOUNTER — Telehealth: Payer: Self-pay | Admitting: Cardiology

## 2020-06-21 ENCOUNTER — Encounter: Payer: Self-pay | Admitting: Cardiology

## 2020-06-21 VITALS — BP 140/94 | HR 112 | Ht 75.0 in | Wt 260.2 lb

## 2020-06-21 DIAGNOSIS — I471 Supraventricular tachycardia: Secondary | ICD-10-CM

## 2020-06-21 DIAGNOSIS — I10 Essential (primary) hypertension: Secondary | ICD-10-CM

## 2020-06-21 HISTORY — DX: Essential (primary) hypertension: I10

## 2020-06-21 MED ORDER — METOPROLOL SUCCINATE ER 25 MG PO TB24: 12.5000 mg | ORAL_TABLET | Freq: Two times a day (BID) | ORAL | 3 refills | Status: DC

## 2020-06-21 NOTE — Patient Instructions (Signed)
Medication Instructions:  Increase Metoprolol to 12.5 mg twice a day   *If you need a refill on your cardiac medications before your next appointment, please call your pharmacy*   Lab Work: None ordered   If you have labs (blood work) drawn today and your tests are completely normal, you will receive your results only by: Marland Kitchen MyChart Message (if you have MyChart) OR . A paper copy in the mail If you have any lab test that is abnormal or we need to change your treatment, we will call you to review the results.   Testing/Procedures: None ordered    Follow-Up: At [facility], you and your health needs are our priority.  As part of our continuing mission to provide you with exceptional heart care, we have created designated Provider Care Teams.  These Care Teams include your primary Cardiologist (physician) and Advanced Practice Providers (APPs -  Physician Assistants and Nurse Practitioners) who all work together to provide you with the care you need, when you need it.  We recommend signing up for the patient portal called "MyChart".  Sign up information is provided on this After Visit Summary.  MyChart is used to connect with patients for Virtual Visits (Telemedicine).  Patients are able to view lab/test results, encounter notes, upcoming appointments, etc.  Non-urgent messages can be sent to your provider as well.   To learn more about what you can do with MyChart, go to [email or web address].    Your next appointment:   2 week(s)  The format for your next appointment:   In Person  Provider:   Norman Herrlich, MD or Thomasene Ripple, DO   Other Instructions none

## 2020-06-21 NOTE — Telephone Encounter (Signed)
Pt c/o of Chest Pain: STAT if CP now or developed within 24 hours  1. Are you having CP right now?  chest pressure- started this morning  2. Are you experiencing any other symptoms (ex. SOB, nausea, vomiting, sweating)?no  3. How long have you been experiencing CP? Started this morning  4. Is your CP continuous or coming and going?she did not know  5. Have you taken Nitroglycerin?no, does not have any- pt wants to be seen?

## 2020-06-21 NOTE — Telephone Encounter (Signed)
Pt states that he has been having lt lower chest tightness with elevated heart rate and blood pressure. Pt denies shob, N/V or diaphoresis. Pt has an appointment to see Dr. Servando Salina today at 10:00.

## 2020-06-21 NOTE — Progress Notes (Signed)
Cardiology Office Note:    Date:  06/21/2020   ID:  Christopher Boone, DOB 01-Aug-1961, MRN [id number]  PCP:  Gordan Payment., MD  Cardiologist:  No primary care provider on file.  Electrophysiologist:  None   Referring MD: No ref. provider found  " I had a really fast heart rate this morning"  History of Present Illness:    Christopher Boone is a 59 y.o. male with a hx of hypertension, SVT presents today to be evaluated for a fast heart rate.  Patient tells me that he has been doing well since he saw Dr. Dulce Sellar back in November 2020.  The patient did see Dr. Dulce Sellar on October 15, 2019 at that time his Toprol-XL was continued and plans for follow-up in 1 year.  But today he started experience significant increase in heart rate.  He states that given the fact that he took an additional Toprol-XL and his heart rate only went down by a little bit he decided come to the office to be evaluated.  He notes that during the time of his increased heart rate he did have some left-sided chest pain which improved once his heart rate improved.  No other complaints at this time.  Past Medical History:  Diagnosis Date  . Hypertension   . SVT (supraventricular tachycardia) (HCC) 05/08/2018    Past Surgical History:  Procedure Laterality Date  . KNEE CARTILAGE SURGERY      Current Medications: Current Meds  Medication Sig  . aspirin 81 MG tablet Take 81 mg by mouth daily.  . TOPROL XL 25 MG 24 hr tablet TAKE 1/2 TABLET(12.5 MG) BY MOUTH DAILY     Allergies:   Patient has no known allergies.   Social History   Socioeconomic History  . Marital status: Unknown    Spouse name: Not on file  . Number of children: Not on file  . Years of education: Not on file  . Highest education level: Not on file  Occupational History  . Not on file  Tobacco Use  . Smoking status: Never Smoker  . Smokeless tobacco: Never Used  Vaping Use  . Vaping Use: Never used  Substance and Sexual Activity  . Alcohol use: No    . Drug use: Never  . Sexual activity: Not on file  Other Topics Concern  . Not on file  Social History Narrative  . Not on file   Social Determinants of Health   Financial Resource Strain:   . Difficulty of Paying Living Expenses:   Food Insecurity:   . Worried About Programme researcher, broadcasting/film/video in the Last Year:   . Barista in the Last Year:   Transportation Needs:   . Freight forwarder (Medical):   Marland Kitchen Lack of Transportation (Non-Medical):   Physical Activity:   . Days of Exercise per Week:   . Minutes of Exercise per Session:   Stress:   . Feeling of Stress :   Social Connections:   . Frequency of Communication with Friends and Family:   . Frequency of Social Gatherings with Friends and Family:   . Attends Religious Services:   . Active Member of Clubs or Organizations:   . Attends Banker Meetings:   Marland Kitchen Marital Status:      Family History: The patient's family history includes Arthritis in his father; Cervical cancer in his mother.  ROS:   Review of Systems  Constitution: Negative for decreased appetite, fever and weight gain.  HENT: Negative for congestion, ear discharge, hoarse voice and sore throat.   Eyes: Negative for discharge, redness, vision loss in right eye and visual halos.  Cardiovascular: Reports chest pain and palpitations.Negative for dyspnea on exertion, leg swelling, orthopnea  Respiratory: Negative for cough, hemoptysis, shortness of breath and snoring.   Endocrine: Negative for heat intolerance and polyphagia.  Hematologic/Lymphatic: Negative for bleeding problem. Does not bruise/bleed easily.  Skin: Negative for flushing, nail changes, rash and suspicious lesions.  Musculoskeletal: Negative for arthritis, joint pain, muscle cramps, myalgias, neck pain and stiffness.  Gastrointestinal: Negative for abdominal pain, bowel incontinence, diarrhea and excessive appetite.  Genitourinary: Negative for decreased libido, genital sores and  incomplete emptying.  Neurological: Negative for brief paralysis, focal weakness, headaches and loss of balance.  Psychiatric/Behavioral: Negative for altered mental status, depression and suicidal ideas.  Allergic/Immunologic: Negative for HIV exposure and persistent infections.    EKGs/Labs/Other Studies Reviewed:    The following studies were reviewed today:   EKG:  The ekg ordered today demonstrates  Sinus tachycardia, heart rate 112 bpm, compared to EKG done in July 2019 no significant change at this time no PVC present.  Recent Labs: 10/15/2019: ALT 27; BUN 15; Creatinine, Ser 0.79; Potassium 4.7; Sodium 138  Recent Lipid Panel    Component Value Date/Time   CHOL 164 10/15/2019 0912   TRIG 89 10/15/2019 0912   HDL 33 (L) 10/15/2019 0912   CHOLHDL 5.0 10/15/2019 0912   LDLCALC 114 (H) 10/15/2019 0912    Physical Exam:    VS:  BP (!) 140/94   Pulse (!) 112   Ht 6\' 3"  (1.905 m)   Wt (!) 260 lb 3.2 oz (118 kg)   SpO2 97%   BMI 32.52 kg/m     Wt Readings from Last 3 Encounters:  06/21/20 (!) 260 lb 3.2 oz (118 kg)  10/15/19 275 lb (124.7 kg)  05/21/18 268 lb 12.8 oz (121.9 kg)     GEN: Well nourished, well developed in no acute distress HEENT: Normal NECK: No JVD; No carotid bruits LYMPHATICS: No lymphadenopathy CARDIAC: S1S2 noted,RRR, no murmurs, rubs, gallops RESPIRATORY:  Clear to auscultation without rales, wheezing or rhonchi  ABDOMEN: Soft, non-tender, non-distended, +bowel sounds, no guarding. EXTREMITIES: No edema, No cyanosis, no clubbing MUSCULOSKELETAL:  No deformity  SKIN: Warm and dry NEUROLOGIC:  Alert and oriented x 3, non-focal PSYCHIATRIC:  Normal affect, good insight  ASSESSMENT:    1. SVT (supraventricular tachycardia) (HCC)   2. Essential hypertension    PLAN:    For his sinus tachycardia in the setting of a history of SVT I am going to increase his Toprol-XL from 12.5 mg daily to 12.5 mg twice a day.  He is agreeable to make this  change.  His chest pain does sound atypical which I do think it is in the setting of his tachyarrhythmia therefore we will continue to monitor.  Currently the chest pain has resolved and he only experiences this to him increase periods of heartbeats.  His blood pressure is elevated in the office today but I do think that the increased dose of Toprol-XL will help bring him back to his target of 130/80 mmHg.  The patient is in agreement with the above plan. The patient left the office in stable condition.  The patient will follow up in 2 weeks with Dr. 07/22/18 or Ortha Metts.   Medication Adjustments/Labs and Tests Ordered: Current medicines are reviewed at length with the patient today.  Concerns regarding medicines  are outlined above.  No orders of the defined types were placed in this encounter.  No orders of the defined types were placed in this encounter.   There are no Patient Instructions on file for this visit.   Adopting a Healthy Lifestyle.  Know what a healthy weight is for you (roughly BMI <25) and aim to maintain this   Aim for 7+ servings of fruits and vegetables daily   65-80+ fluid ounces of water or unsweet tea for healthy kidneys   Limit to max 1 drink of alcohol per day; avoid smoking/tobacco   Limit animal fats in diet for cholesterol and heart health - choose grass fed whenever available   Avoid highly processed foods, and foods high in saturated/trans fats   Aim for low stress - take time to unwind and care for your mental health   Aim for 150 min of moderate intensity exercise weekly for heart health, and weights twice weekly for bone health   Aim for 7-9 hours of sleep daily   When it comes to diets, agreement about the perfect plan isnt easy to find, even among the experts. Experts at the [facility] of [employer] developed an idea known as the Healthy Eating Plate. Just imagine a plate divided into logical, healthy portions.   The emphasis is on diet  quality:   Load up on vegetables and fruits - one-half of your plate: Aim for color and variety, and remember that potatoes dont count.   Go for whole grains - one-quarter of your plate: Whole wheat, barley, wheat berries, quinoa, oats, brown rice, and foods made with them. If you want pasta, go with whole wheat pasta.   Protein power - one-quarter of your plate: Fish, chicken, beans, and nuts are all healthy, versatile protein sources. Limit red meat.   The diet, however, does go beyond the plate, offering a few other suggestions.   Use healthy plant oils, such as olive, canola, soy, corn, sunflower and peanut. Check the labels, and avoid partially hydrogenated oil, which have unhealthy trans fats.   If youre thirsty, drink water. Coffee and tea are good in moderation, but skip sugary drinks and limit milk and dairy products to one or two daily servings.   The type of carbohydrate in the diet is more important than the amount. Some sources of carbohydrates, such as vegetables, fruits, whole grains, and beans-are healthier than others.   Finally, stay active  Signed, Thomasene Ripple, DO  06/21/2020 10:58 AM    [facility]

## 2020-06-29 ENCOUNTER — Encounter (HOSPITAL_COMMUNITY): Payer: Self-pay | Admitting: Emergency Medicine

## 2020-06-29 ENCOUNTER — Emergency Department (HOSPITAL_COMMUNITY)
Admission: EM | Admit: 2020-06-29 | Discharge: 2020-06-29 | Disposition: A | Discharge status: Home / Self Care | Payer: BC Managed Care – PPO | Attending: Emergency Medicine | Admitting: Emergency Medicine

## 2020-06-29 ENCOUNTER — Other Ambulatory Visit: Payer: Self-pay

## 2020-06-29 ENCOUNTER — Emergency Department (HOSPITAL_COMMUNITY): Payer: BC Managed Care – PPO

## 2020-06-29 DIAGNOSIS — I10 Essential (primary) hypertension: Secondary | ICD-10-CM | POA: Insufficient documentation

## 2020-06-29 DIAGNOSIS — R002 Palpitations: Secondary | ICD-10-CM

## 2020-06-29 DIAGNOSIS — Z7982 Long term (current) use of aspirin: Secondary | ICD-10-CM | POA: Insufficient documentation

## 2020-06-29 DIAGNOSIS — R079 Chest pain, unspecified: Secondary | ICD-10-CM | POA: Diagnosis not present

## 2020-06-29 DIAGNOSIS — Z79899 Other long term (current) drug therapy: Secondary | ICD-10-CM | POA: Diagnosis not present

## 2020-06-29 LAB — BASIC METABOLIC PANEL
Anion gap: 10 (ref 5–15)
BUN: 15 mg/dL (ref 6–20)
CO2: 23 mmol/L (ref 22–32)
Calcium: 8.5 mg/dL — ABNORMAL LOW (ref 8.9–10.3)
Chloride: 105 mmol/L (ref 98–111)
Creatinine, Ser: 0.76 mg/dL (ref 0.61–1.24)
GFR calc Af Amer: >60 mL/min (ref >60)
GFR calc non Af Amer: >60 mL/min (ref >60)
Glucose, Bld: 101 mg/dL — ABNORMAL HIGH (ref 70–99)
Potassium: 3.8 mmol/L (ref 3.5–5.1)
Sodium: 138 mmol/L (ref 135–145)

## 2020-06-29 LAB — CBC
HCT: 46 % (ref 39.0–52.0)
Hemoglobin: 16.2 g/dL (ref 13.0–17.0)
MCH: 32.7 pg (ref 26.0–34.0)
MCHC: 35.2 g/dL (ref 30.0–36.0)
MCV: 92.7 fL (ref 80.0–100.0)
Platelets: 187 10*3/uL (ref 150–400)
RBC: 4.96 MIL/uL (ref 4.22–5.81)
RDW: 13.5 % (ref 11.5–15.5)
WBC: 8.7 10*3/uL (ref 4.0–10.5)
nRBC: 0 % (ref 0.0–0.2)

## 2020-06-29 LAB — TROPONIN I (HIGH SENSITIVITY): Troponin I (High Sensitivity): 3 ng/L (ref <18)

## 2020-06-29 MED ORDER — SODIUM CHLORIDE 0.9% FLUSH: 3.0000 mL | Freq: Once | INTRAVENOUS | Status: DC

## 2020-06-29 NOTE — ED Provider Notes (Signed)
[facility]-EMERGENCY DEPT Provider Note   CSN: [id number] Arrival date & time: 06/29/20  0445     History Chief Complaint  Patient presents with  . Chest Pain    Christopher Boone is a 59 y.o. male.  59 year old male presents with palpitations that woke him up from sleep.  Has history of same and saw his physician 8 days ago and had his dose of Lopressor adjusted.  Denies any cardiac symptoms with this.  States symptoms wax and wane and have resolved at this point.  When he did have symptoms, he denied any diaphoresis, dyspnea.  Notes no other new medication changes.  No leg pain or swelling.  States he has been dealing with this since he was in his 75s and has been evaluated by cardiology multiple times including EP study which did not show any findings        Past Medical History:  Diagnosis Date  . Hypertension   . SVT (supraventricular tachycardia) (HCC) 05/08/2018    Patient Active Problem List   Diagnosis Date Noted  . Essential hypertension 06/21/2020  . SVT (supraventricular tachycardia) (HCC) 05/08/2018    Past Surgical History:  Procedure Laterality Date  . KNEE CARTILAGE SURGERY         Family History  Problem Relation Age of Onset  . Cervical cancer Mother   . Arthritis Father     Social History   Tobacco Use  . Smoking status: Never Smoker  . Smokeless tobacco: Never Used  Vaping Use  . Vaping Use: Never used  Substance Use Topics  . Alcohol use: No  . Drug use: Never    Home Medications Prior to Admission medications   Medication Sig Start Date End Date Taking? Authorizing Provider  aspirin 81 MG tablet Take 81 mg by mouth daily.    [provider]  metoprolol succinate (TOPROL XL) 25 MG 24 hr tablet Take 0.5 tablets (12.5 mg total) by mouth in the morning and at bedtime. 06/21/20   Tobb, Lavona Mound, DO    Allergies    Patient has no known allergies.  Review of Systems   Review of Systems  All other  systems reviewed and are negative.   Physical Exam Updated Vital Signs BP (!) 159/99 (BP Location: Left Arm)   Pulse 77   Temp 97.8 F (36.6 C) (Oral)   Resp 13   Ht 1.905 m (6\' 3" )   Wt 113.4 kg   SpO2 100%   BMI 31.25 kg/m   Physical Exam Vitals and nursing note reviewed.  Constitutional:      General: He is not in acute distress.    Appearance: Normal appearance. He is well-developed. He is not toxic-appearing.  HENT:     Head: Normocephalic and atraumatic.  Eyes:     General: Lids are normal.     Conjunctiva/sclera: Conjunctivae normal.     Pupils: Pupils are equal, round, and reactive to light.  Neck:     Thyroid: No thyroid mass.     Trachea: No tracheal deviation.  Cardiovascular:     Rate and Rhythm: Normal rate and regular rhythm.     Heart sounds: Normal heart sounds. No murmur heard.  No gallop.   Pulmonary:     Effort: Pulmonary effort is normal. No respiratory distress.     Breath sounds: Normal breath sounds. No stridor. No decreased breath sounds, wheezing, rhonchi or rales.  Abdominal:     General: Bowel sounds are normal. There  is no distension.     Palpations: Abdomen is soft.     Tenderness: There is no abdominal tenderness. There is no rebound.  Musculoskeletal:        General: No tenderness. Normal range of motion.     Cervical back: Normal range of motion and neck supple.  Skin:    General: Skin is warm and dry.     Findings: No abrasion or rash.  Neurological:     Mental Status: He is alert and oriented to person, place, and time.     GCS: GCS eye subscore is 4. GCS verbal subscore is 5. GCS motor subscore is 6.     Cranial Nerves: No cranial nerve deficit.     Sensory: No sensory deficit.  Psychiatric:        Speech: Speech normal.        Behavior: Behavior normal.     ED Results / Procedures / Treatments   Labs (all labs ordered are listed, but only abnormal results are displayed) Labs Reviewed  BASIC METABOLIC PANEL - Abnormal;  Notable for the following components:      Result Value   Glucose, Bld 101 (*)    Calcium 8.5 (*)    All other components within normal limits  CBC  TROPONIN I (HIGH SENSITIVITY)  TROPONIN I (HIGH SENSITIVITY)    EKG EKG Interpretation  Date/Time:  Tuesday June 29 2020 05:04:02 EDT Ventricular Rate:  85 PR Interval:    QRS Duration: 104 QT Interval:  375 QTC Calculation: 446 R Axis:   37 Text Interpretation: Sinus rhythm 12 Lead; Mason-Likar Confirmed by Lorre Nick ([id number]) on 06/29/2020 10:23:23 AM   Radiology DG Chest 2 View  Result Date: 06/29/2020 CLINICAL DATA:  Chest pain.  Increased heart rate. EXAM: CHEST - 2 VIEW COMPARISON:  Two-view chest x-ray 01/21/2014 FINDINGS: The heart size and mediastinal contours are within normal limits. Both lungs are clear. The visualized skeletal structures are unremarkable. IMPRESSION: No active cardiopulmonary disease. Electronically Signed   By: Marin Roberts M.D.   On: 06/29/2020 06:09    Procedures Procedures (including critical care time)  Medications Ordered in ED Medications  sodium chloride flush (NS) 0.9 % injection 3 mL (has no administration in time range)    ED Course  I have reviewed the triage vital signs and the nursing notes.  Pertinent labs & imaging results that were available during my care of the patient were reviewed by me and considered in my medical decision making (see chart for details).    MDM Rules/Calculators/A&P                          Patient's chest x-ray laboratory studies here are within normal limits.  EKG without acute findings.  Monitored here for several hours and no signs of ectopy.  Will discharge patient and keep him on the same dose of Lopressor and have him follow-up with his doctor Final Clinical Impression(s) / ED Diagnoses Final diagnoses:  None    Rx / DC Orders ED Discharge Orders    None       Lorre Nick, MD 06/29/20 1041

## 2020-06-29 NOTE — ED Triage Notes (Signed)
Pt reports having rapid heart rate for the last week at 0300 and pain in RUQ with radiation into chest. Pt reports being seen by cardiologist and just instructed to increase Metoprolol.

## 2020-07-08 ENCOUNTER — Encounter: Payer: Self-pay | Admitting: Cardiology

## 2020-07-08 ENCOUNTER — Ambulatory Visit: Payer: BC Managed Care – PPO | Admitting: Cardiology

## 2020-07-08 ENCOUNTER — Other Ambulatory Visit: Payer: Self-pay

## 2020-07-08 ENCOUNTER — Ambulatory Visit (INDEPENDENT_AMBULATORY_CARE_PROVIDER_SITE_OTHER): Payer: BC Managed Care – PPO

## 2020-07-08 VITALS — BP 120/98 | HR 90 | Ht 75.0 in | Wt 265.0 lb

## 2020-07-08 DIAGNOSIS — E669 Obesity, unspecified: Secondary | ICD-10-CM | POA: Diagnosis not present

## 2020-07-08 DIAGNOSIS — I10 Essential (primary) hypertension: Secondary | ICD-10-CM | POA: Diagnosis not present

## 2020-07-08 DIAGNOSIS — I471 Supraventricular tachycardia: Secondary | ICD-10-CM

## 2020-07-08 DIAGNOSIS — R002 Palpitations: Secondary | ICD-10-CM

## 2020-07-08 HISTORY — DX: Obesity, unspecified: E66.9

## 2020-07-08 NOTE — Progress Notes (Signed)
Cardiology Office Note:    Date:  07/08/2020   ID:  [name], DOB 04/04/1961, MRN [id number]  PCP:  Gordan Payment., MD  Cardiologist:  No primary care provider on file.  Electrophysiologist:  None   Referring MD: Gordan Payment., MD   Chief Complaint  Patient presents with  . Follow-up  " I was in the ED last week for my hear rhythm again."  History of Present Illness:    Christopher Boone is a 59 y.o. male with a hx of hypertension, PSVT on metoprolol presents today for follow-up visit.  Patient usually follows with Dr. Kathlene November.  He presented on June 21, 2020 as he had been experiencing palpitations at home.  At his visit that day I increase his Toprol-XL to 25 mg daily.  He states since his visit he has been doing well and steady on the increased dose of Toprol-XL.  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  He put his pulse ox on he noted that his heart rate was 130 bpm.  He felt a bit lightheaded.  With this he went to the emergency department to be evaluated.  While in emergency department his EKG was normal there was no evidence of any significant arrhythmia.  Was asked to follow-up with cardiology.  He is here today for follow-up visit.  Past Medical History:  Diagnosis Date  . Hypertension   . SVT (supraventricular tachycardia) (HCC) 05/08/2018    Past Surgical History:  Procedure Laterality Date  . KNEE CARTILAGE SURGERY      Current Medications: Current Meds  Medication Sig  . aspirin 81 MG tablet Take 81 mg by mouth daily.  . metoprolol succinate (TOPROL XL) 25 MG 24 hr tablet Take 0.5 tablets (12.5 mg total) by mouth in the morning and at bedtime.     Allergies:   Patient has no known allergies.   Social History   Socioeconomic History  . Marital status: Unknown    Spouse name: Not on file  . Number of children: Not on file  . Years of education: Not on file  . Highest  education level: Not on file  Occupational History  . Not on file  Tobacco Use  . Smoking status: Never Smoker  . Smokeless tobacco: Never Used  Vaping Use  . Vaping Use: Never used  Substance and Sexual Activity  . Alcohol use: No  . Drug use: Never  . Sexual activity: Not on file  Other Topics Concern  . Not on file  Social History Narrative  . Not on file   Social Determinants of Health   Financial Resource Strain:   . Difficulty of Paying Living Expenses: Not on file  Food Insecurity:   . Worried About Programme researcher, broadcasting/film/video in the Last Year: Not on file  . Ran Out of Food in the Last Year: Not on file  Transportation Needs:   . Lack of Transportation (Medical): Not on file  . Lack of Transportation (Non-Medical): Not on file  Physical Activity:   . Days of Exercise per Week: Not on file  . Minutes of Exercise per Session: Not on file  Stress:   . Feeling of Stress : Not on file  Social Connections:   . Frequency of Communication with Friends and Family: Not on file  . Frequency of Social Gatherings with Friends and Family: Not on file  . Attends  Religious Services: Not on file  . Active Member of Clubs or Organizations: Not on file  . Attends Banker Meetings: Not on file  . Marital Status: Not on file     Family History: The patient's family history includes Arthritis in his father; Cervical cancer in his mother.  ROS:   Review of Systems  Constitution: Negative for decreased appetite, fever and weight gain.  HENT: Negative for congestion, ear discharge, hoarse voice and sore throat.   Eyes: Negative for discharge, redness, vision loss in right eye and visual halos.  Cardiovascular: Negative for chest pain, dyspnea on exertion, leg swelling, orthopnea and palpitations.  Respiratory: Negative for cough, hemoptysis, shortness of breath and snoring.   Endocrine: Negative for heat intolerance and polyphagia.  Hematologic/Lymphatic: Negative for bleeding  problem. Does not bruise/bleed easily.  Skin: Negative for flushing, nail changes, rash and suspicious lesions.  Musculoskeletal: Negative for arthritis, joint pain, muscle cramps, myalgias, neck pain and stiffness.  Gastrointestinal: Negative for abdominal pain, bowel incontinence, diarrhea and excessive appetite.  Genitourinary: Negative for decreased libido, genital sores and incomplete emptying.  Neurological: Negative for brief paralysis, focal weakness, headaches and loss of balance.  Psychiatric/Behavioral: Negative for altered mental status, depression and suicidal ideas.  Allergic/Immunologic: Negative for HIV exposure and persistent infections.    EKGs/Labs/Other Studies Reviewed:    The following studies were reviewed today:   EKG: None today, however I reviewed his EKG which was done on June 29, 2020 which showed sinus rhythm, heart rate 85 bpm.  Recent Labs: 10/15/2019: ALT 27 06/29/2020: BUN 15; Creatinine, Ser 0.76; Hemoglobin 16.2; Platelets 187; Potassium 3.8; Sodium 138  Recent Lipid Panel    Component Value Date/Time   CHOL 164 10/15/2019 0912   TRIG 89 10/15/2019 0912   HDL 33 (L) 10/15/2019 0912   CHOLHDL 5.0 10/15/2019 0912   LDLCALC 114 (H) 10/15/2019 0912    Physical Exam:    VS:  BP (!) 120/98 (BP Location: Right Arm, Patient Position: Sitting, Cuff Size: Large)   Pulse 90   Ht 6\' 3"  (1.905 m)   Wt 265 lb (120.2 kg)   SpO2 94%   BMI 33.12 kg/m     Wt Readings from Last 3 Encounters:  07/08/20 265 lb (120.2 kg)  06/29/20 250 lb (113.4 kg)  06/21/20 (!) 260 lb 3.2 oz (118 kg)     GEN: Well nourished, well developed in no acute distress HEENT: Normal NECK: No JVD; No carotid bruits LYMPHATICS: No lymphadenopathy CARDIAC: S1S2 noted,RRR, no murmurs, rubs, gallops RESPIRATORY:  Clear to auscultation without rales, wheezing or rhonchi  ABDOMEN: Soft, non-tender, non-distended, +bowel sounds, no guarding. EXTREMITIES: No edema, No cyanosis, no  clubbing MUSCULOSKELETAL:  No deformity  SKIN: Warm and dry NEUROLOGIC:  Alert and oriented x 3, non-focal PSYCHIATRIC:  Normal affect, good insight  ASSESSMENT:    1. SVT (supraventricular tachycardia) (HCC)   2. Essential hypertension   3. Obesity (BMI 30-39.9)    PLAN:     1.  His symptoms has improved he tells me.  At this time I like to place a monitor on the patient for 14 days to have better understanding.  We will continue him on his current dose of Toprol XL 25 mg daily. 2.  His blood pressure deceptively in the office today no changes will be made to his antihypertensive medication. 3.  The patient understands the need to lose weight with diet and exercise. We have discussed specific strategies for this.  The patient is in agreement with the above plan. The patient left the office in stable condition.  The patient will follow up in 2 months with Dr. Dulce Sellar.   Medication Adjustments/Labs and Tests Ordered: Current medicines are reviewed at length with the patient today.  Concerns regarding medicines are outlined above.  No orders of the defined types were placed in this encounter.  No orders of the defined types were placed in this encounter.   There are no Patient Instructions on file for this visit.   Adopting a Healthy Lifestyle.  Know what a healthy weight is for you (roughly BMI <25) and aim to maintain this   Aim for 7+ servings of fruits and vegetables daily   65-80+ fluid ounces of water or unsweet tea for healthy kidneys   Limit to max 1 drink of alcohol per day; avoid smoking/tobacco   Limit animal fats in diet for cholesterol and heart health - choose grass fed whenever available   Avoid highly processed foods, and foods high in saturated/trans fats   Aim for low stress - take time to unwind and care for your mental health   Aim for 150 min of moderate intensity exercise weekly for heart health, and weights twice weekly for bone health   Aim for  7-9 hours of sleep daily   When it comes to diets, agreement about the perfect plan isnt easy to find, even among the experts. Experts at the [facility] [employer] developed an idea known as the Healthy Eating Plate. Just imagine a plate divided into logical, healthy portions.   The emphasis is on diet quality:   Load up on vegetables and fruits - one-half of your plate: Aim for color and variety, and remember that potatoes dont count.   Go for whole grains - one-quarter of your plate: Whole wheat, barley, wheat berries, quinoa, oats, brown rice, and foods made with them. If you want pasta, go with whole wheat pasta.   Protein power - one-quarter of your plate: Fish, chicken, beans, and nuts are all healthy, versatile protein sources. Limit red meat.   The diet, however, does go beyond the plate, offering a few other suggestions.   Use healthy plant oils, such as olive, canola, soy, corn, sunflower and peanut. Check the labels, and avoid partially hydrogenated oil, which have unhealthy trans fats.   If youre thirsty, drink water. Coffee and tea are good in moderation, but skip sugary drinks and limit milk and dairy products to one or two daily servings.   The type of carbohydrate in the diet is more important than the amount. Some sources of carbohydrates, such as vegetables, fruits, whole grains, and beans-are healthier than others.   Finally, stay active  Signed, Thomasene Ripple, DO  07/08/2020 10:38 AM    [facility]

## 2020-07-08 NOTE — Patient Instructions (Signed)
Medication Instructions:  No medication changes. *If you need a refill on your cardiac medications before your next appointment, please call your pharmacy*   Lab Work: None ordered If you have labs (blood work) drawn today and your tests are completely normal, you will receive your results only by: Marland Kitchen MyChart Message (if you have MyChart) OR . A paper copy in the mail If you have any lab test that is abnormal or we need to change your treatment, we will call you to review the results.   Testing/Procedures:  WHY IS MY DOCTOR PRESCRIBING ZIO? The Zio system is proven and trusted by physicians to detect and diagnose irregular heart rhythms -- and has been prescribed to hundreds of thousands of patients.  The FDA has cleared the Zio system to monitor for many different kinds of irregular heart rhythms. In a study, physicians were able to reach a diagnosis 90% of the time with the Zio system1.  You can wear the Zio monitor -- a small, discreet, comfortable patch -- during your normal day-to-day activity, including while you sleep, shower, and exercise, while it records every single heartbeat for analysis.  1Barrett, P., et al. Comparison of 24 Hour Holter Monitoring Versus 14 Day Novel Adhesive Patch Electrocardiographic Monitoring. American Journal of Medicine, 2014.  ZIO VS. HOLTER MONITORING The Zio monitor can be comfortably worn for up to 14 days. Holter monitors can be worn for 24 to 48 hours, limiting the time to record any irregular heart rhythms you may have. Zio is able to capture data for the 51% of patients who have their first symptom-triggered arrhythmia after 48 hours.1  LIVE WITHOUT RESTRICTIONS The Zio ambulatory cardiac monitor is a small, unobtrusive, and water-resistant patch--you might even forget you're wearing it. The Zio monitor records and stores every beat of your heart, whether you're sleeping, working out, or showering.  Wear the monitor for 2 weeks, remove  07/22/20.   Follow-Up: At [facility], you and your health needs are our priority.  As part of our continuing mission to provide you with exceptional heart care, we have created designated Provider Care Teams.  These Care Teams include your primary Cardiologist (physician) and Advanced Practice Providers (APPs -  Physician Assistants and Nurse Practitioners) who all work together to provide you with the care you need, when you need it.  We recommend signing up for the patient portal called "MyChart".  Sign up information is provided on this After Visit Summary.  MyChart is used to connect with patients for Virtual Visits (Telemedicine).  Patients are able to view lab/test results, encounter notes, upcoming appointments, etc.  Non-urgent messages can be sent to your provider as well.   To learn more about what you can do with MyChart, go to [email or web address].    Your next appointment:   2 month(s)  The format for your next appointment:   In Person  Provider:   Norman Herrlich, MD   Other Instructions NA

## 2020-08-16 ENCOUNTER — Telehealth: Payer: Self-pay

## 2020-08-16 NOTE — Telephone Encounter (Signed)
-----   Message from Thomasene Ripple, DO sent at 08/13/2020  5:10 PM EDT ----- He had very rare beats that came from the top of the heart.  No change in  your medication is recommended at this time.

## 2020-08-16 NOTE — Telephone Encounter (Signed)
Spoke with patient regarding results and recommendation.  Patient verbalizes understanding and is agreeable to plan of care. Advised patient to call back with any issues or concerns.  

## 2020-08-20 ENCOUNTER — Other Ambulatory Visit: Payer: Self-pay

## 2020-08-20 ENCOUNTER — Ambulatory Visit (INDEPENDENT_AMBULATORY_CARE_PROVIDER_SITE_OTHER): Payer: BC Managed Care – PPO | Admitting: Cardiology

## 2020-08-20 VITALS — BP 133/82 | HR 88 | Ht 75.0 in | Wt 268.0 lb

## 2020-08-20 DIAGNOSIS — Z79899 Other long term (current) drug therapy: Secondary | ICD-10-CM

## 2020-08-20 DIAGNOSIS — I471 Supraventricular tachycardia: Secondary | ICD-10-CM | POA: Diagnosis not present

## 2020-08-20 DIAGNOSIS — I10 Essential (primary) hypertension: Secondary | ICD-10-CM

## 2020-08-20 MED ORDER — FLECAINIDE ACETATE 50 MG PO TABS: 50.0000 mg | ORAL_TABLET | Freq: Two times a day (BID) | ORAL | 3 refills | Status: DC

## 2020-08-20 NOTE — Progress Notes (Signed)
Cardiology Office Note:    Date:  08/20/2020   ID:  [name], DOB [birth year], MRN [id number]  PCP:  Gordan Payment., MD  Cardiologist:  Norman Herrlich, MD    Referring MD: Gordan Payment., MD    ASSESSMENT:    1. SVT (supraventricular tachycardia) (HCC)   2. Hypertension, unspecified type   3. High risk medication use    PLAN:    In order of problems listed above:  1. Unfortunately had recurrent SVT and I suspect when he has his paroxysmal atrial tachycardia of brief episodes on the monitor.  I reviewed with him the benign nature avoidance of over-the-counter proarrhythmic's and encouraged him to accept flecainide I think would improve the quality of his life and discouraged him from another EP procedure.  I think he will comply but he wants to go home and speak with his wife. 2. BP at target 3. If he starts flecainide EKG flecainide level 1 week 4. At his request wellness labs will be performed   Next appointment: 6 months   Medication Adjustments/Labs and Tests Ordered: Current medicines are reviewed at length with the patient today.  Concerns regarding medicines are outlined above.  Orders Placed This Encounter  Procedures  . Flecainide level  . Comprehensive metabolic panel  . Lipid panel   Meds ordered this encounter  Medications  . flecainide (TAMBOCOR) 50 MG tablet    Sig: Take 1 tablet (50 mg total) by mouth 2 (two) times daily.    Dispense:  60 tablet    Refill:  3    No chief complaint on file.   History of Present Illness:    Christopher Boone is a 59 y.o. male with a hx of hypertension SVT and recent breakthrough episode of rapid heart rhythm last seen 07/08/2020 after ED visit.  Subsequently he wore a ZIO monitor for 14 days showing rare ventricular ectopy as well as APCs with brief runs of APCs the longest 16 complexes rate of 133 bpm.  There were no episodes of atrial fibrillation or flutter Compliance with diet, lifestyle and  medications: Yes  He is low traumatized by his recurrent episode of tachycardia.  Unfortunately underwent an EP study and was noninducible.  I have told him that flecainide is a good option I think that it would resolve his issues he is hesitant to take it he is can discuss with his wife is a Engineer, civil (consulting) and if he wants to start it he will call us we will check a level in 1 week and bring him back to the office for an EKG.  Outside of the episodes he is not having cardiovascular symptoms of chest pain shortness of breath edema and has not had syncope.  He avoids over-the-counter proarrhythmic drugs Past Medical History:  Diagnosis Date  . Essential hypertension 06/21/2020  . Hypertension   . Obesity (BMI 30-39.9) 07/08/2020  . SVT (supraventricular tachycardia) (HCC) 05/08/2018    Past Surgical History:  Procedure Laterality Date  . KNEE CARTILAGE SURGERY      Current Medications: Current Meds  Medication Sig  . aspirin 81 MG tablet Take 81 mg by mouth daily.  . metoprolol succinate (TOPROL XL) 25 MG 24 hr tablet Take 0.5 tablets (12.5 mg total) by mouth in the morning and at bedtime.     Allergies:   Patient has no known allergies.   Social History   Socioeconomic History  . Marital status: Unknown    Spouse name: Not  on file  . Number of children: Not on file  . Years of education: Not on file  . Highest education level: Not on file  Occupational History  . Not on file  Tobacco Use  . Smoking status: Never Smoker  . Smokeless tobacco: Never Used  Vaping Use  . Vaping Use: Never used  Substance and Sexual Activity  . Alcohol use: No  . Drug use: Never  . Sexual activity: Not on file  Other Topics Concern  . Not on file  Social History Narrative  . Not on file   Social Determinants of Health   Financial Resource Strain:   . Difficulty of Paying Living Expenses: Not on file  Food Insecurity:   . Worried About Programme researcher, broadcasting/film/video in the Last Year: Not on file  . Ran Out of  Food in the Last Year: Not on file  Transportation Needs:   . Lack of Transportation (Medical): Not on file  . Lack of Transportation (Non-Medical): Not on file  Physical Activity:   . Days of Exercise per Week: Not on file  . Minutes of Exercise per Session: Not on file  Stress:   . Feeling of Stress : Not on file  Social Connections:   . Frequency of Communication with Friends and Family: Not on file  . Frequency of Social Gatherings with Friends and Family: Not on file  . Attends Religious Services: Not on file  . Active Member of Clubs or Organizations: Not on file  . Attends Banker Meetings: Not on file  . Marital Status: Not on file     Family History: The patient's family history includes Arthritis in his father; Cervical cancer in his mother. ROS:   Please see the history of present illness.    All other systems reviewed and are negative.  EKGs/Labs/Other Studies Reviewed:    The following studies were reviewed today:  EKG:  EKG 06/30/2020 from the ED personally reviewed sinus rhythm normal no evidence of preexcitation  Recent Labs: 10/15/2019: ALT 27 06/29/2020: BUN 15; Creatinine, Ser 0.76; Hemoglobin 16.2; Platelets 187; Potassium 3.8; Sodium 138  Recent Lipid Panel    Component Value Date/Time   CHOL 164 10/15/2019 0912   TRIG 89 10/15/2019 0912   HDL 33 (L) 10/15/2019 0912   CHOLHDL 5.0 10/15/2019 0912   LDLCALC 114 (H) 10/15/2019 0912    Physical Exam:    VS:  BP 133/82   Pulse 88   Ht 6\' 3"  (1.905 m)   Wt 268 lb (121.6 kg)   SpO2 95%   BMI 33.50 kg/m     Wt Readings from Last 3 Encounters:  08/20/20 268 lb (121.6 kg)  07/08/20 265 lb (120.2 kg)  06/29/20 250 lb (113.4 kg)     GEN:  Well nourished, well developed in no acute distress HEENT: Normal NECK: No JVD; No carotid bruits LYMPHATICS: No lymphadenopathy CARDIAC: RRR, no murmurs, rubs, gallops RESPIRATORY:  Clear to auscultation without rales, wheezing or rhonchi    ABDOMEN: Soft, non-tender, non-distended MUSCULOSKELETAL:  No edema; No deformity  SKIN: Warm and dry NEUROLOGIC:  Alert and oriented x 3 PSYCHIATRIC:  Normal affect    Signed, 08/29/20, MD  08/20/2020 12:06 PM    [facility]

## 2020-08-20 NOTE — Patient Instructions (Addendum)
Medication Instructions:  Your physician has recommended you make the following change in your medication:  START: Flecainide 50 mg take one tablet by mouth twice daily.  *If you need a refill on your cardiac medications before your next appointment, please call your pharmacy*   Lab Work: Your physician recommends that you return for lab work in: TODAY CMP, Lipids  In one week- Flecainide level  If you have labs (blood work) drawn today and your tests are completely normal, you will receive your results only by: Marland Kitchen MyChart Message (if you have MyChart) OR . A paper copy in the mail If you have any lab test that is abnormal or we need to change your treatment, we will call you to review the results.   Testing/Procedures: None   Follow-Up: At [facility], you and your health needs are our priority.  As part of our continuing mission to provide you with exceptional heart care, we have created designated Provider Care Teams.  These Care Teams include your primary Cardiologist (physician) and Advanced Practice Providers (APPs -  Physician Assistants and Nurse Practitioners) who all work together to provide you with the care you need, when you need it.  We recommend signing up for the patient portal called "MyChart".  Sign up information is provided on this After Visit Summary.  MyChart is used to connect with patients for Virtual Visits (Telemedicine).  Patients are able to view lab/test results, encounter notes, upcoming appointments, etc.  Non-urgent messages can be sent to your provider as well.   To learn more about what you can do with MyChart, go to [email or web address].    Your next appointment:   Please call us to schedule an appointment in one week if you start taking the Flecainide  The format for your next appointment:   In Person  Provider:      Other Instructions

## 2020-08-21 LAB — COMPREHENSIVE METABOLIC PANEL
ALT: 29 IU/L (ref 0–44)
AST: 24 IU/L (ref 0–40)
Albumin/Globulin Ratio: 1.7 (ref 1.2–2.2)
Albumin: 4.5 g/dL (ref 3.8–4.9)
Alkaline Phosphatase: 79 IU/L (ref 44–121)
BUN/Creatinine Ratio: 12 (ref 10–24)
BUN: 10 mg/dL (ref 8–27)
Bilirubin Total: 0.5 mg/dL (ref 0.0–1.2)
CO2: 20 mmol/L (ref 20–29)
Calcium: 8.8 mg/dL (ref 8.6–10.2)
Chloride: 102 mmol/L (ref 96–106)
Creatinine, Ser: 0.81 mg/dL (ref 0.76–1.27)
GFR calc Af Amer: 112 mL/min/{1.73_m2} (ref >59)
GFR calc non Af Amer: 97 mL/min/{1.73_m2} (ref >59)
Globulin, Total: 2.6 g/dL (ref 1.5–4.5)
Glucose: 77 mg/dL (ref 65–99)
Potassium: 4.4 mmol/L (ref 3.5–5.2)
Sodium: 138 mmol/L (ref 134–144)
Total Protein: 7.1 g/dL (ref 6.0–8.5)

## 2020-08-21 LAB — LIPID PANEL
Chol/HDL Ratio: 6 ratio — ABNORMAL HIGH (ref 0.0–5.0)
Cholesterol, Total: 180 mg/dL (ref 100–199)
HDL: 30 mg/dL — ABNORMAL LOW (ref >39)
LDL Chol Calc (NIH): 125 mg/dL — ABNORMAL HIGH (ref 0–99)
Triglycerides: 138 mg/dL (ref 0–149)
VLDL Cholesterol Cal: 25 mg/dL (ref 5–40)

## 2020-08-23 ENCOUNTER — Telehealth: Payer: Self-pay

## 2020-08-23 NOTE — Telephone Encounter (Signed)
Spoke with patient regarding results and recommendation.  Patient verbalizes understanding and is agreeable to plan of care. Advised patient to call back with any issues or concerns.  

## 2020-09-18 ENCOUNTER — Other Ambulatory Visit: Payer: Self-pay | Admitting: Cardiology

## 2020-09-20 ENCOUNTER — Other Ambulatory Visit: Payer: Self-pay | Admitting: Cardiology

## 2020-09-20 MED ORDER — METOPROLOL SUCCINATE ER 25 MG PO TB24: 12.5000 mg | ORAL_TABLET | Freq: Two times a day (BID) | ORAL | 3 refills | Status: DC

## 2020-09-20 NOTE — Telephone Encounter (Signed)
Refill sent in per request.  

## 2020-09-20 NOTE — Telephone Encounter (Signed)
°*  STAT* If patient is at the pharmacy, call can be transferred to refill team.   1. Which medications need to be refilled? (please list name of each medication and dose if known) TOPROL XL 25 MG 24 hr tablet  2. Which pharmacy/location (including street and city if local pharmacy) is medication to be sent to? [facility] [phone] - [place], [place] - [address]  3. Do they need a 30 day or 90 day supply? 90 day supply

## 2020-10-01 ENCOUNTER — Other Ambulatory Visit: Payer: Self-pay | Admitting: Physician Assistant

## 2020-10-01 ENCOUNTER — Telehealth: Payer: Self-pay | Admitting: Cardiology

## 2020-10-01 MED ORDER — TOPROL XL 25 MG PO TB24: 12.5000 mg | ORAL_TABLET | Freq: Two times a day (BID) | ORAL | 3 refills | Status: DC

## 2020-10-01 NOTE — Telephone Encounter (Signed)
I have resent the Rx with specification it should be Brand name Toprol XL only. Patient says he tried the generic metoprolol succinate in the past which did not work as well.

## 2020-10-01 NOTE — Telephone Encounter (Signed)
*  STAT* If patient is at the pharmacy, call can be transferred to refill team.   1. Which medications need to be refilled? (please list name of each medication and dose if known) new prescription for Toprol XL- can not take the Metoprolol  2. Which pharmacy/location (including street and city if local pharmacy) is medication to be sent to? [facility] - [place]   3. Do they need a 30 day or 90 day supply?  90 days and refills

## 2020-10-05 ENCOUNTER — Other Ambulatory Visit: Payer: Self-pay

## 2020-10-05 MED ORDER — TOPROL XL 25 MG PO TB24: 12.5000 mg | ORAL_TABLET | Freq: Two times a day (BID) | ORAL | 3 refills | Status: DC

## 2020-10-05 NOTE — Telephone Encounter (Signed)
Spoke to the patient and the patients wife just now. She states that they are having a hard time getting his TOPROL XL. She states that the patients pharmacy states that this is an issue with his insurance company. I asked her what the issue was but she states that she does not know. I advised that she should call the insurance company to see what the issue is with the medication. She verbalizes understanding and states that she will call the insurance company now and will let us know what she finds out.    Encouraged patient to call back with any questions or concerns.

## 2020-10-06 NOTE — Telephone Encounter (Signed)
Spoke to the patient just now and let him know that I have not received any kind of fax from his insurance company in regards to his medication at this time. He verbalizes understanding and thanks me for the call back.

## 2020-10-07 NOTE — Telephone Encounter (Signed)
Spoke to the patient just now and he let me know that he has received this medication from the pharmacy and has been able to take it daily. He thanks me for the call back.   Encouraged patient to call back with any questions or concerns.

## 2020-10-07 NOTE — Telephone Encounter (Signed)
I submitted the prior authorization for this TOPROL XL prescription and it unfortunately has been denied at this time. The patient has not tried enough formulary alternatives to meet the insurances requirement. I will route this message to Dr. Dulce Sellar to see what he would like to do from here.

## 2021-06-27 ENCOUNTER — Telehealth: Payer: Self-pay | Admitting: Cardiology

## 2021-06-27 MED ORDER — FLECAINIDE ACETATE 50 MG PO TABS: 50.0000 mg | ORAL_TABLET | Freq: Two times a day (BID) | ORAL | 3 refills | Status: DC

## 2021-06-27 NOTE — Telephone Encounter (Signed)
Pts wife states that pt is running a  6- lead ekg having pbcs every third beat.. please advise

## 2021-06-27 NOTE — Telephone Encounter (Signed)
Spoke to the patient just now and requested that they send [place] the report via MyChart. She states that they will do so and I helped them to reset their password so that they could get into MyChart.   Encouraged patient to call back with any questions or concerns.

## 2021-06-29 ENCOUNTER — Emergency Department (HOSPITAL_COMMUNITY): Payer: BC Managed Care – PPO

## 2021-06-29 ENCOUNTER — Encounter (HOSPITAL_COMMUNITY): Payer: Self-pay

## 2021-06-29 ENCOUNTER — Emergency Department (HOSPITAL_COMMUNITY)
Admission: EM | Admit: 2021-06-29 | Discharge: 2021-06-29 | Disposition: A | Discharge status: Home / Self Care | Payer: BC Managed Care – PPO | Attending: Emergency Medicine | Admitting: Emergency Medicine

## 2021-06-29 ENCOUNTER — Other Ambulatory Visit: Payer: Self-pay | Admitting: Student

## 2021-06-29 ENCOUNTER — Other Ambulatory Visit: Payer: Self-pay

## 2021-06-29 DIAGNOSIS — R002 Palpitations: Secondary | ICD-10-CM | POA: Insufficient documentation

## 2021-06-29 DIAGNOSIS — R0789 Other chest pain: Secondary | ICD-10-CM | POA: Diagnosis not present

## 2021-06-29 DIAGNOSIS — R072 Precordial pain: Secondary | ICD-10-CM

## 2021-06-29 DIAGNOSIS — R079 Chest pain, unspecified: Secondary | ICD-10-CM

## 2021-06-29 DIAGNOSIS — Z20822 Contact with and (suspected) exposure to covid-19: Secondary | ICD-10-CM | POA: Insufficient documentation

## 2021-06-29 DIAGNOSIS — Z7982 Long term (current) use of aspirin: Secondary | ICD-10-CM | POA: Diagnosis not present

## 2021-06-29 DIAGNOSIS — I10 Essential (primary) hypertension: Secondary | ICD-10-CM | POA: Diagnosis not present

## 2021-06-29 DIAGNOSIS — I493 Ventricular premature depolarization: Secondary | ICD-10-CM | POA: Diagnosis not present

## 2021-06-29 DIAGNOSIS — Z79899 Other long term (current) drug therapy: Secondary | ICD-10-CM | POA: Insufficient documentation

## 2021-06-29 LAB — CBC
HCT: 55.8 % — ABNORMAL HIGH (ref 39.0–52.0)
Hemoglobin: 19.9 g/dL — ABNORMAL HIGH (ref 13.0–17.0)
MCH: 32.6 pg (ref 26.0–34.0)
MCHC: 35.7 g/dL (ref 30.0–36.0)
MCV: 91.5 fL (ref 80.0–100.0)
Platelets: 223 10*3/uL (ref 150–400)
RBC: 6.1 MIL/uL — ABNORMAL HIGH (ref 4.22–5.81)
RDW: 13.7 % (ref 11.5–15.5)
WBC: 12.2 10*3/uL — ABNORMAL HIGH (ref 4.0–10.5)
nRBC: 0 % (ref 0.0–0.2)

## 2021-06-29 LAB — D-DIMER, QUANTITATIVE: D-Dimer, Quant: 1.95 ug/mL-FEU — ABNORMAL HIGH (ref 0.00–0.50)

## 2021-06-29 LAB — BASIC METABOLIC PANEL
Anion gap: 9 (ref 5–15)
BUN: 17 mg/dL (ref 8–23)
CO2: 25 mmol/L (ref 22–32)
Calcium: 8.9 mg/dL (ref 8.9–10.3)
Chloride: 105 mmol/L (ref 98–111)
Creatinine, Ser: 1 mg/dL (ref 0.61–1.24)
GFR, Estimated: >60 mL/min (ref >60)
Glucose, Bld: 97 mg/dL (ref 70–99)
Potassium: 4 mmol/L (ref 3.5–5.1)
Sodium: 139 mmol/L (ref 135–145)

## 2021-06-29 LAB — TSH: TSH: 1.654 u[IU]/mL (ref 0.350–4.500)

## 2021-06-29 LAB — RESP PANEL BY RT-PCR (FLU A&B, COVID) ARPGX2
Influenza A by PCR: NEGATIVE
Influenza B by PCR: NEGATIVE
SARS Coronavirus 2 by RT PCR: NEGATIVE

## 2021-06-29 LAB — TROPONIN I (HIGH SENSITIVITY)
Troponin I (High Sensitivity): 5 ng/L (ref <18)
Troponin I (High Sensitivity): 4 ng/L (ref <18)

## 2021-06-29 LAB — MAGNESIUM: Magnesium: 2.2 mg/dL (ref 1.7–2.4)

## 2021-06-29 MED ORDER — IOHEXOL 350 MG/ML SOLN
100.0000 mL | Freq: Once | INTRAVENOUS | Status: AC | PRN
  Administered 2021-06-29: 75 mL via INTRAVENOUS

## 2021-06-29 NOTE — ED Triage Notes (Signed)
Pt reports chest pain under left breast, denies radiation x1 week. Patient denies shob/n/v. Patient states he sees DR Dulce Sellar for tachycardia and takes metoprolol pt states he started new medication on Monday and it hasn't help with CP.

## 2021-06-29 NOTE — Consult Note (Addendum)
Cardiology Consultation:   Patient ID: Jakwon Gayton MRN: [id number]; DOB: Apr 17, 1961  Admit date: 06/29/2021 Date of Consult: 06/29/2021  PCP:  Gordan Payment., MD   [facility] Providers Cardiologist:  Norman Herrlich, MD   {  Patient Profile:   Ulysees Robarts is a 60 y.o. male with a history of SVT, hypertension, and obesity of who is being seen or the evaluation of chest pain and palpitations at the request of Dr. Wallace Cullens.  History of Present Illness:   Mr. Panuco is a 60 year old male with the above history who is followed by Dr. Dulce Sellar. He has known SVT. He states he had an EP Study at [facility] about 20 years ago which showed no inducible arrhythmia. He had a recurrent episodes of SVT in 06/2020 and was seen in the ED. Zio monitor following this showed underlying sinus rhythm with 3 short runs of SVT and rare PACs/PVC but no VT, atrial fibrillation, or pauses. Patient was last seen by Dr. Dulce Sellar in 08/2020 who recommended starting Flecainide. Patient never started this because Toprol-XL seemed to help. However, a few days ago he started to note he was having frequent PVC (trigeminy) so he started Flecainide.   Patient presented to the [facility] ED this morning for further evaluation of chest pain and palpitations. Patient reported he started feeling more fatigued about 1 week ago and then noticed he was having frequent PVCs on his Lourena Simmond[employer] App. He tried increasing his Toprol-XL but this did not help. He then message Dr. Dulce Sellar on Monday 06/27/2021 and that was when Flecainide was started. He reports palpitations and pinpoint chest discomfort that he describes as a pressure under his left breast. He states he has had this discomfort for the last 40 years and that it seems to be related to his SVT/PVC. He reportedly had a coronary calcium score about 20 years ago and has had multiple stress test at Dr. Hulen Shouts office in [place]. I am unable to see any of these but sounds like they  were normal. No shortness of breath, orthopnea, PND, edema. No lightheadedness, dizziness, syncope. No recent fevers or illnesses. No abnormal bleeding in urine or stools.  In the ED, patient mildly hypertensive but vitals stable. EKG shows borderline sinus tachycardia, rate 101 bpm, with trigeminy PVCs but no acute ischemic changes. Frequent ectopy noted on telemetry with ventricular PVCs at times. Initial high-sensitivity troponin negative. Chest x-ray showed no acute findings. WBC 12.2, Hgb 19.9, Plts 223. Na 139, K 4.0, Glucose 97, BUN 17, Cr 1.0. COVID-19 pending.  Past Medical History:  Diagnosis Date   Essential hypertension 06/21/2020   Hypertension    Obesity (BMI 30-39.9) 07/08/2020   SVT (supraventricular tachycardia) (HCC) 05/08/2018    Past Surgical History:  Procedure Laterality Date   KNEE CARTILAGE SURGERY       Home Medications:  Prior to Admission medications   Medication Sig Start Date End Date Taking? Authorizing Provider  acetaminophen (TYLENOL) 500 MG tablet Take 1,000 mg by mouth every 6 (six) hours as needed for mild pain.   Yes [provider]  flecainide (TAMBOCOR) 50 MG tablet Take 1 tablet (50 mg total) by mouth 2 (two) times daily. 06/27/21  Yes Baldo Daub, MD  TOPROL XL 25 MG 24 hr tablet Take 0.5 tablets (12.5 mg total) by mouth in the morning and at bedtime. 10/05/20  Yes Baldo Daub, MD    Inpatient Medications: Scheduled Meds:  Continuous Infusions:  PRN  Meds:   Allergies:   No Known Allergies  Social History:   Social History   Socioeconomic History   Marital status: Unknown    Spouse name: Not on file   Number of children: Not on file   Years of education: Not on file   Highest education level: Not on file  Occupational History   Not on file  Tobacco Use   Smoking status: Never   Smokeless tobacco: Never  Vaping Use   Vaping Use: Never used  Substance and Sexual Activity   Alcohol use: No   Drug use: Never   Sexual  activity: Not on file  Other Topics Concern   Not on file  Social History Narrative   Not on file   Social Determinants of Health   Financial Resource Strain: Not on file  Food Insecurity: Not on file  Transportation Needs: Not on file  Physical Activity: Not on file  Stress: Not on file  Social Connections: Not on file  Intimate Partner Violence: Not on file    Family History:   Family History  Problem Relation Age of Onset   Cervical cancer Mother    Arthritis Father      ROS:  Please see the history of present illness.  Review of Systems  Constitutional:  Positive for malaise/fatigue. Negative for fever.  HENT:  Negative for congestion.   Respiratory:  Negative for cough and shortness of breath.   Cardiovascular:  Positive for chest pain (pressure) and palpitations. Negative for orthopnea, leg swelling and PND.  Gastrointestinal:  Negative for blood in stool, melena, nausea and vomiting.  Genitourinary:  Negative for hematuria.  Musculoskeletal:  Negative for myalgias.  Neurological:  Negative for dizziness and loss of consciousness.  Endo/Heme/Allergies:  Does not bruise/bleed easily.  Psychiatric/Behavioral:  Negative for substance abuse.       Physical Exam/Data:   Vitals:   06/29/21 1000 06/29/21 1030 06/29/21 1100 06/29/21 1130  BP: 135/89 (!) 141/90 (!) 146/92 125/83  Pulse: 83 69 81 86  Resp: 14 20 16 18   Temp:      TempSrc:      SpO2: 95% 96% 92% 92%  Weight:      Height:       No intake or output data in the 24 hours ending 06/29/21 1146 Last 3 Weights 06/29/2021 08/20/2020 07/08/2020  Weight (lbs) 266 lb 12.1 oz 268 lb 265 lb  Weight (kg) 121 kg 121.564 kg 120.203 kg     Body mass index is 33.34 kg/m.  General: 61 y.o. male resting comfortably in no acute distress.  HEENT: Normocephalic and atraumatic. Sclera clear.  Neck: Supple. No carotid bruits. No JVD. Heart: RRR with frequent ectopy. Distinct S1 and S2. No murmurs, gallops, or rubs. Radial  and distal pedal pulses 2+ and equal bilaterally. Lungs: No increased work of breathing. Clear to ausculation bilaterally. No wheezes, rhonchi, or rales.  Abdomen: Soft, non-distended, and non-tender to palpation. Bowel sounds present in all 4 quadrants.  Extremities: No lower extremity edema.    Skin: Warm and dry. Neuro: Alert and oriented x3. No focal deficits. Psych: Normal affect. Responds appropriately.   EKG:  The EKG was personally reviewed and demonstrates: Sinus tachycardia, rate 101 bpm, with trigeminy PVCs but no acute ST/T changes. Telemetry:  Telemetry was personally reviewed and demonstrates:  Normals sinus rhythm with rates in the 80. Frequent ventricular ectopy with bigeminy PVCs at times.   Relevant CV Studies: Zio Monitor 06/2020: The patient wore  the monitor for 13 days 16 hours starting July 08, 2020. Indication: Palpitations   The minimum heart rate was 50 bpm, maximum heart rate was 174 bpm, and average heart rate was 81 bpm. Predominant underlying rhythm was Sinus Rhythm.   3 Supraventricular Tachycardia runs occurred, the run with the fastest interval lasting 4 beats with a maximum rate of 174 bpm, the longest lasting 16 beats with an average rate of 133 bpm.   Premature atrial complexes were rare less than 1%. Premature Ventricular complexes were rare less than 1%.   No ventricular tachycardia, no pauses, No AV block and no atrial fibrillation present. 1 diary event associated with sinus rhythm.   Conclusion: Rare paroxysmal supraventricular tachycardia.  Laboratory Data:  High Sensitivity Troponin:   Recent Labs  Lab 06/29/21 0813 06/29/21 1048  TROPONINIHS 5 4     Chemistry Recent Labs  Lab 06/29/21 0813  NA 139  K 4.0  CL 105  CO2 25  GLUCOSE 97  BUN 17  CREATININE 1.00  CALCIUM 8.9  GFRNONAA >60  ANIONGAP 9    No results for input(s): PROT, ALBUMIN, AST, ALT, ALKPHOS, BILITOT in the last 168 hours. Hematology Recent Labs  Lab  06/29/21 0813  WBC 12.2*  RBC 6.10*  HGB 19.9*  HCT 55.8*  MCV 91.5  MCH 32.6  MCHC 35.7  RDW 13.7  PLT 223   BNPNo results for input(s): BNP, PROBNP in the last 168 hours.  DDimer  Recent Labs  Lab 06/29/21 1048  DDIMER 1.95*     Radiology/Studies:  DG Chest Port 1 View  Result Date: 06/29/2021 CLINICAL DATA:  Chest pain EXAM: PORTABLE CHEST 1 VIEW COMPARISON:  06/29/2021 0539 FINDINGS: The heart size and mediastinal contours are within normal limits. Both lungs are clear. The visualized skeletal structures are unremarkable. IMPRESSION: No active disease. Electronically Signed   By: Wiliam Ke MD   On: 06/29/2021 08:57     Assessment and Plan:   Chest Pain Frequent PVCs History of SVT - Patient has been having frequent PVCs for the last week. He notes some pinpoint chest pressure under left breast with this but states he has had this for 40 years.  - EKG shows sinus tachycardia with trigeminy PVCs but no acute ST/T changes.  - Initial high-sensitivity troponin negative. Repeat pending.  - Potassium and Magnesium normal. TSH normal. - D-dimer elevated at 1.95. Chest CTA pending. - On Toprol-XL 12.5mg  twice daily. Can increase to 25mg  twice daily.  - Recently started on Flecainide 50mg  twice daily. - Do not think chest pressure is ischemic in nature. Recommend outpatient monitor to assess PVC burden. Also recommend Echo to assess LV function but this can likely be done as an outpatient. Also, may benefit from outpatient stress test.   Risk Assessment/Risk Scores:   HEAR Score (for undifferentiated chest pain):  HEAR Score: 2{  For questions or updates, please contact [facility] Please consult [email or web address] for contact info under    Signed, , PA-C  06/29/2021 11:46 AM   I have seen and examined the patient along with Corrin Parker, PA-C .  I have reviewed the chart, notes and new data.  I agree with PA/NP's note.  Key new complaints:  Currently asymptomatic.  Not feeling chest pain or PVCs. Key examination changes: Normal cardiovascular exam. 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  Official interpretation of chest CT angiogram is pending, but on my review I do not see evidence of pulmonary embolism, the heart size is normal, the coronary arteries have normal origin, there is scanty atherosclerotic calcification seen in the distribution of the coronary arteries, particularly in the LAD artery and minimal aortic atherosclerosis.  Although his PVCs are monomorphic they do not have a morphology that suggest they are easily accessible for radiofrequency ablation.  PLAN: It's too soon to really say whether the metoprolol plus flecainide combination will be successful in alleviating his complaints.  Would give this a little while to work.  If unsuccessful would refer to electrophysiology to discuss alternative treatment.  Agree with outpatient echocardiogram and a treadmill stress test after he has been on flecainide for a couple of weeks. No further inpatient  Cardiology workup recommended at this time.  Thurmon FairMihai Haeley Fordham, MD, [facility] Regional HospitalFACC [facility] [phone] 06/29/2021, 12:25 PM

## 2021-06-29 NOTE — ED Notes (Signed)
Pt in bed, pt placed on 2L via [place] secondary to low O2 sat, pt 89-90 on room air, pt appears to be sinus rhythm on the monitor, pt has decreased ectopy.  Sig other at bedside, resps even and unlabored

## 2021-06-29 NOTE — ED Notes (Signed)
Pt in bed, sig other at bedside, pt states that he has a little bit of pressure/pain in his L chest, states that he has had it for the past 40 years, pt appears to be sinus rhythm with little to no ectopy on the monitor at this time.

## 2021-06-29 NOTE — Progress Notes (Signed)
Ordered Echo and treadmill stress test. Will also refer to EP. Please see consult note from today for more information.  Corrin Parker, PA-C 06/29/2021 3:47 PM

## 2021-06-29 NOTE — ED Provider Notes (Signed)
[facility]-EMERGENCY DEPT Provider Note   CSN: [id number] Arrival date & time: 06/29/21  0741     History Chief Complaint  Patient presents with   Chest Pain    Christopher Boone is a 60 y.o. male.  The history is provided by the patient and medical records. No language interpreter was used.  Chest Pain  60 year old male significant history of hypertension, obesity, SVT presenting today complaining of heart palpitation.  History obtained through patient and through wife who is at bedside.  Patient has history of heart palpitations in his 72s.  Patient has been on Toprol since which usually does control with symptoms.  However for the past several weeks he has noticed progressive worsening heart palpitation with associated chest discomfort.  He was seen by his cardiologist and was placed on flecainide 50 mg twice daily for the past week but noticed no significant provement.  He is also been placed on a cardiac heart monitoring for 2 weeks.  He is here today due to worsening heart palpitation this morning with pain in his chest.  He denies lightheadedness or dizziness but does endorse generalized fatigue for the past week.  No fever productive cough trouble swallowing arm pain or jaw pain.  Past Medical History:  Diagnosis Date   Essential hypertension 06/21/2020   Hypertension    Obesity (BMI 30-39.9) 07/08/2020   SVT (supraventricular tachycardia) (HCC) 05/08/2018    Patient Active Problem List   Diagnosis Date Noted   Hypertension    Obesity (BMI 30-39.9) 07/08/2020   Essential hypertension 06/21/2020   SVT (supraventricular tachycardia) (HCC) 05/08/2018    Past Surgical History:  Procedure Laterality Date   KNEE CARTILAGE SURGERY         Family History  Problem Relation Age of Onset   Cervical cancer Mother    Arthritis Father     Social History   Tobacco Use   Smoking status: Never   Smokeless tobacco: Never  Vaping Use   Vaping Use:  Never used  Substance Use Topics   Alcohol use: No   Drug use: Never    Home Medications Prior to Admission medications   Medication Sig Start Date End Date Taking? Authorizing Provider  aspirin 81 MG tablet Take 81 mg by mouth daily.    [provider]  flecainide (TAMBOCOR) 50 MG tablet Take 1 tablet (50 mg total) by mouth 2 (two) times daily. 06/27/21   Baldo Daub, MD  TOPROL XL 25 MG 24 hr tablet Take 0.5 tablets (12.5 mg total) by mouth in the morning and at bedtime. 10/05/20   Baldo Daub, MD    Allergies    Patient has no known allergies.  Review of Systems   Review of Systems  Cardiovascular:  Positive for chest pain.  All other systems reviewed and are negative.  Physical Exam Updated Vital Signs BP (!) 161/79 (BP Location: Left Arm)   Pulse 66   Temp 98.2 F (36.8 C) (Oral)   Resp 16   Ht 6\' 3"  (1.905 m)   Wt 121 kg   SpO2 96%   BMI 33.34 kg/m   Physical Exam Vitals and nursing note reviewed.  Constitutional:      General: He is not in acute distress.    Appearance: He is well-developed.  HENT:     Head: Atraumatic.  Eyes:     Conjunctiva/sclera: Conjunctivae normal.  Cardiovascular:     Rate and Rhythm: Rhythm irregular.  Pulmonary:  Effort: Pulmonary effort is normal.     Breath sounds: Normal breath sounds.  Abdominal:     General: Abdomen is flat.     Palpations: Abdomen is soft.     Tenderness: There is no abdominal tenderness.  Musculoskeletal:     Cervical back: Neck supple.     Right lower leg: No edema.     Left lower leg: No edema.  Skin:    Findings: No rash.  Neurological:     Mental Status: He is alert.  Psychiatric:        Mood and Affect: Mood normal.    ED Results / Procedures / Treatments   Labs (all labs ordered are listed, but only abnormal results are displayed) Labs Reviewed  CBC - Abnormal; Notable for the following components:      Result Value   WBC 12.2 (*)    RBC 6.10 (*)    Hemoglobin 19.9  (*)    HCT 55.8 (*)    All other components within normal limits  D-DIMER, QUANTITATIVE - Abnormal; Notable for the following components:   D-Dimer, Quant 1.95 (*)    All other components within normal limits  RESP PANEL BY RT-PCR (FLU A&B, COVID) ARPGX2  BASIC METABOLIC PANEL  TSH  MAGNESIUM  TROPONIN I (HIGH SENSITIVITY)  TROPONIN I (HIGH SENSITIVITY)    EKG None ED ECG REPORT   Date: 06/29/2021  Rate: 101  Rhythm: sinus tachycardia and premature ventricular contractions (PVC)  QRS Axis: normal  Intervals: normal  ST/T Wave abnormalities: normal  Conduction Disutrbances:none  Narrative Interpretation:   Old EKG Reviewed: unchanged  I have personally reviewed the EKG tracing and agree with the computerized printout as noted.   Radiology CT Angio Chest PE W and/or Wo Contrast  Result Date: 06/29/2021 CLINICAL DATA:  Chest pain, positive D-dimer EXAM: CT ANGIOGRAPHY CHEST WITH CONTRAST TECHNIQUE: Multidetector CT imaging of the chest was performed using the standard protocol during bolus administration of intravenous contrast. Multiplanar CT image reconstructions and MIPs were obtained to evaluate the vascular anatomy. CONTRAST:  71mL OMNIPAQUE IOHEXOL 350 MG/ML SOLN COMPARISON:  CT 11/05/2008.  Chest x-ray 06/29/2021 FINDINGS: Cardiovascular: Satisfactory contrast bolus timing. Evaluation of the distal pulmonary arteries is slightly limited by beam hardening artifact related to habitus and respiratory motion artifact. No filling defect identified to the segmental branch level. Heart size is upper limits of normal. No pericardial effusion. Thoracic aorta is nonaneurysmal. Scattered thoracic aortic atherosclerosis. Mediastinum/Nodes: No enlarged mediastinal, hilar, or axillary lymph nodes. Thyroid gland, trachea, and esophagus demonstrate no significant findings. Lungs/Pleura: Lungs are clear. No pleural effusion or pneumothorax. Upper Abdomen: No acute abnormality. Musculoskeletal:  No chest wall abnormality. No acute or significant osseous findings. Review of the MIP images confirms the above findings. IMPRESSION: 1. No evidence of pulmonary embolism to the segmental branch level. 2. Lungs are clear. 3. Aortic atherosclerosis (ICD10-I70.0). Electronically Signed   By: Duanne Guess D.O.   On: 06/29/2021 12:50   DG Chest Port 1 View  Result Date: 06/29/2021 CLINICAL DATA:  Chest pain EXAM: PORTABLE CHEST 1 VIEW COMPARISON:  06/29/2021 0539 FINDINGS: The heart size and mediastinal contours are within normal limits. Both lungs are clear. The visualized skeletal structures are unremarkable. IMPRESSION: No active disease. Electronically Signed   By: Wiliam Ke MD   On: 06/29/2021 08:57    Procedures Procedures   Medications Ordered in ED Medications  iohexol (OMNIPAQUE) 350 MG/ML injection 100 mL (75 mLs Intravenous Contrast Given 06/29/21 1208)  ED Course  I have reviewed the triage vital signs and the nursing notes.  Pertinent labs & imaging results that were available during my care of the patient were reviewed by me and considered in my medical decision making (see chart for details).    MDM Rules/Calculators/A&P                           BP (!) 130/91   Pulse 86   Temp 98.2 F (36.8 C) (Oral)   Resp 13   Ht 6\' 3"  (1.905 m)   Wt 121 kg   SpO2 93%   BMI 33.34 kg/m   Final Clinical Impression(s) / ED Diagnoses Final diagnoses:  Heart palpitations    Rx / DC Orders ED Discharge Orders     None      Patient with significant history of SVT who has had trouble with heart palpitation especially within the past week.  Cardiology is aware and he is currently on a continuous heart monitoring device.  He is here with worsening heart palpitation along with chest discomfort.  He was noted to be in an irregular rhythm as low was 30s heart rate per nurse.  Patient immediately placed on pads.  However on the monitor it appears pt is in a bigeminy pattern  without obvious bradycardia. will consult cardiology. Fortunately blood pressure is stable at this time.  9:03 AM Appreciate consultation from cardiology I spoke with Dr. who will have a team to see and evaluate patient in the ER.  1:18 PM EKG shows sinus tachycardia with frequent PVC.  Negative delta troponin.  COVID test negative, labs overall reassuring with exception of elevated D-dimer of 1.95.  A chest CT angiogram obtained showed no acute finding.  Cardiology has seen and evaluated patient and felt that patient chest discomfort is not likely to be ischemic in nature.  They recommend outpatient monitor to assess PVC burden also recommend echo to assess LV function as well as outpatient stress test.  Overall patient can be discharged   Clifton Jayko, PA-C 06/29/21 1326    08/29/21, DO 06/29/21 2142

## 2021-06-29 NOTE — Discharge Instructions (Addendum)
Your heart palpitation is related to PVC.  Please call and follow-up closely with cardiologist for further managements of your condition.  Fortunately no evidence of blood clot in your lungs or signs of heart attack during this visit.  Your thyroid function panels are normal.  Return if you have any concern.

## 2021-06-29 NOTE — ED Notes (Signed)
Pt in bed, sig other at bedside, pt states that he is ready to go home, pt ambulatory from dpt.

## 2021-06-30 ENCOUNTER — Telehealth (HOSPITAL_COMMUNITY): Payer: Self-pay | Admitting: Cardiology

## 2021-06-30 NOTE — Telephone Encounter (Signed)
I called to schedule patient for the echocardiogram and GXT that Marjie Skiff PA ordered for patient.  He wants to wait to see Dr. Dulce Sellar before scheduling and he is scheduled to see him next week.  He states he will do the test but wants to see how soon he needs and how long he needs to be on the medicine before having procedures. Thank you

## 2021-07-04 ENCOUNTER — Other Ambulatory Visit: Payer: Self-pay

## 2021-07-04 ENCOUNTER — Ambulatory Visit (INDEPENDENT_AMBULATORY_CARE_PROVIDER_SITE_OTHER): Payer: BC Managed Care – PPO

## 2021-07-04 VITALS — BP 150/98 | HR 88 | Ht 73.0 in | Wt 265.0 lb

## 2021-07-04 DIAGNOSIS — R002 Palpitations: Secondary | ICD-10-CM | POA: Diagnosis not present

## 2021-07-04 DIAGNOSIS — I10 Essential (primary) hypertension: Secondary | ICD-10-CM

## 2021-07-04 DIAGNOSIS — Z79899 Other long term (current) drug therapy: Secondary | ICD-10-CM

## 2021-07-04 NOTE — Progress Notes (Signed)
Patient came in today for a nurse visit one week post starting on his flecainide medication. The EKG was reviewed by Dr. Servando Salina and she states that it looks good. The patient was made aware of this and is going to have his lab work completed this morning as well.    Encouraged patient to call back with any questions or concerns.

## 2021-07-04 NOTE — Progress Notes (Signed)
a 

## 2021-07-06 NOTE — Addendum Note (Signed)
Addended by: Delorse Limber I on: 07/06/2021 09:35 AM   Modules accepted: Orders

## 2021-07-07 LAB — FLECAINIDE LEVEL: Flecainide: 0.12 ug/mL — ABNORMAL LOW (ref 0.20–1.00)

## 2021-08-09 ENCOUNTER — Encounter: Payer: Self-pay | Admitting: Cardiology

## 2021-08-09 ENCOUNTER — Ambulatory Visit: Payer: BC Managed Care – PPO | Admitting: Cardiology

## 2021-08-09 ENCOUNTER — Other Ambulatory Visit: Payer: Self-pay

## 2021-08-09 ENCOUNTER — Ambulatory Visit (INDEPENDENT_AMBULATORY_CARE_PROVIDER_SITE_OTHER): Payer: BC Managed Care – PPO

## 2021-08-09 VITALS — BP 144/80 | HR 79 | Ht 75.0 in | Wt 262.4 lb

## 2021-08-09 DIAGNOSIS — I493 Ventricular premature depolarization: Secondary | ICD-10-CM

## 2021-08-09 NOTE — Patient Instructions (Addendum)
Medication Instructions:  Your physician recommends that you continue on your current medications as directed. Please refer to the Current Medication list given to you today.  *If you need a refill on your cardiac medications before your next appointment, please call your pharmacy*   Lab Work: None ordered   Testing/Procedures: Your physician has recommended that you wear a 3 day holter monitor. Holter monitors are medical devices that record the heart's electrical activity. Doctors most often use these monitors to diagnose arrhythmias. Arrhythmias are problems with the speed or rhythm of the heartbeat. The monitor is a small, portable device. You can wear one while you do your normal daily activities.   See instructions below   Follow-Up: At [facility], you and your health needs are our priority.  As part of our continuing mission to provide you with exceptional heart care, we have created designated Provider Care Teams.  These Care Teams include your primary Cardiologist (physician) and Advanced Practice Providers (APPs -  Physician Assistants and Nurse Practitioners) who all work together to provide you with the care you need, when you need it.  Your next appointment:   2 month(s)  The format for your next appointment:   In Person  Provider:   Loman Brooklyn, MD    Thank you for choosing [facility]!!   Dory Horn, RN [phone]   Other Instructions                           ZIO XT- Long Term Monitor Instructions  Your physician has requested you wear a ZIO patch monitor for 3 days.  This is a single patch monitor. [employer] supplies one patch monitor per enrollment. Additional stickers are not available. Please do not apply patch if you will be having a Nuclear Stress Test,  Echocardiogram, Cardiac CT, MRI, or Chest Xray during the period you would be wearing the  monitor. The patch cannot be worn during these tests. You cannot remove and re-apply the  ZIO  XT patch monitor.  Your ZIO patch monitor will be mailed 3 day USPS to your address on file. It may take 3-5 days  to receive your monitor after you have been enrolled.  Once you have received your monitor, please review the enclosed instructions. Your monitor  has already been registered assigning a specific monitor serial # to you.  Billing and Patient Assistance Program Information  We have supplied [employer] with any of your insurance information on file for billing purposes. [employer] offers a sliding scale Patient Assistance Program for patients that do not have  insurance, or whose insurance does not completely cover the cost of the ZIO monitor.  You must apply for the Patient Assistance Program to qualify for this discounted rate.  To apply, please call [employer] at [phone], select option 4, select option 2, ask to apply for  Patient Assistance Program. Meredeth Ide will ask your household income, and how many people  are in your household. They will quote your out-of-pocket cost based on that information.  [employer] will also be able to set up a 51-month, interest-free payment plan if needed.  Applying the monitor   Shave hair from upper left chest.  Hold abrader disc by orange tab. Rub abrader in 40 strokes over the upper left chest as  indicated in your monitor instructions.  Clean area with 4 enclosed alcohol pads. Let dry.  Apply patch as indicated in monitor instructions. Patch will be placed  under collarbone on left  side of chest with arrow pointing upward.  Rub patch adhesive wings for 2 minutes. Remove white label marked "1". Remove the white  label marked "2". Rub patch adhesive wings for 2 additional minutes.  While looking in a mirror, press and release button in center of patch. A small green light will  flash 3-4 times. This will be your only indicator that the monitor has been turned on.  Do not shower for the first 24 hours. You may shower after the first 24 hours.   Press the button if you feel a symptom. You will hear a small click. Record Date, Time and  Symptom in the Patient Logbook.  When you are ready to remove the patch, follow instructions on the last 2 pages of Patient  Logbook. Stick patch monitor onto the last page of Patient Logbook.  Place Patient Logbook in the blue and white box. Use locking tab on box and tape box closed  securely. The blue and white box has prepaid postage on it. Please place it in the mailbox as  soon as possible. Your physician should have your test results approximately 7 days after the  monitor has been mailed back to [employer].  Call [facility] Customer Care at [phone] if you have questions regarding  your ZIO XT patch monitor. Call them immediately if you see an orange light blinking on your  monitor.  If your monitor falls off in less than 4 days, contact our Monitor department at [phone].  If your monitor becomes loose or falls off after 4 days call [employer] at [phone] for  suggestions on securing your monitor

## 2021-08-09 NOTE — Progress Notes (Unsigned)
Patient enrolled for [employer] to mail a 3 day ZIO XT monitor to his address on file. 

## 2021-08-09 NOTE — Progress Notes (Signed)
Electrophysiology Office Note   Date:  08/09/2021   ID:  Christopher Boone, Christopher Boone 1961-10-19, MRN [id number]  PCP:  Gordan Payment., MD  Cardiologist:  Dulce Sellar Primary Electrophysiologist: Kinzy Weyers Jorja Loa, MD    Chief Complaint: Palpitations   History of Present Illness: Christopher Boone is a 60 y.o. male who is being seen today for the evaluation of PVCs at the request of Corrin Parker, PA-C. Presenting today for electrophysiology evaluation.  He has a history of SVT, hypertension, obesity.  He presented emergency room 06/29/2021 with chest pain.  He has had recurrent episodes of SVT most recently 06/2020 was seen in emergency room.  He wore a ZIO monitor that showed underlying sinus rhythm with short runs of SVT.  He was started on Toprol-XL which seem to help with symptoms.  He presented to the [facility] emergency room with chest pain and palpitations.  He was feeling more fatigued.  He noted PVCs on his cardia app.  He increased his dose of Toprol-XL, though this did not help.  He called cardiology clinic and was told to start flecainide.   `  Today, he denies symptoms of palpitations, chest pain, shortness of breath, orthopnea, PND, lower extremity edema, claudication, dizziness, presyncope, syncope, bleeding, or neurologic sequela. The patient is tolerating medications without difficulties.  He is currently feeling well.  Today he does not have many palpitations.  96 PVCs.  They make him feel weak and fatigued he is currently on flecainide and has felt improved since being on this medication.   Past Medical History:  Diagnosis Date   Essential hypertension 06/21/2020   Hypertension    Obesity (BMI 30-39.9) 07/08/2020   SVT (supraventricular tachycardia) (HCC) 05/08/2018   Past Surgical History:  Procedure Laterality Date   KNEE CARTILAGE SURGERY       Current Outpatient Medications  Medication Sig Dispense Refill   acetaminophen (TYLENOL) 500 MG tablet Take 1,000  mg by mouth every 6 (six) hours as needed for mild pain.     flecainide (TAMBOCOR) 50 MG tablet Take 1 tablet (50 mg total) by mouth 2 (two) times daily. 180 tablet 3   TOPROL XL 25 MG 24 hr tablet Take 0.5 tablets (12.5 mg total) by mouth in the morning and at bedtime. 90 tablet 3   No current facility-administered medications for this visit.    Allergies:   Patient has no known allergies.   Social History:  The patient  reports that he has never smoked. He has never used smokeless tobacco. He reports that he does not drink alcohol and does not use drugs.   Family History:  The patient's family history includes Arthritis in his father; Cervical cancer in his mother.    ROS:  Please see the history of present illness.   Otherwise, review of systems is positive for none.   All other systems are reviewed and negative.    PHYSICAL EXAM: VS:  BP (!) 144/80   Pulse 79   Ht 6\' 3"  (1.905 m)   Wt 262 lb 6.4 oz (119 kg)   SpO2 98%   BMI 32.80 kg/m  , BMI Body mass index is 32.8 kg/m. GEN: Well nourished, well developed, in no acute distress  HEENT: normal  Neck: no JVD, carotid bruits, or masses Cardiac: RRR; no murmurs, rubs, or gallops,no edema  Respiratory:  clear to auscultation bilaterally, normal work of breathing GI: soft, nontender, nondistended, + BS MS: no deformity or atrophy  Skin:  warm and dry,  morning Neuro:  Strength and sensation are intact Psych: euthymic mood, full affect  EKG:  EKG is ordered today. Personal review of the ekg ordered shows sinus rhythm  Recent Labs: 08/20/2020: ALT 29 06/29/2021: BUN 17; Creatinine, Ser 1.00; Hemoglobin 19.9; Magnesium 2.2; Platelets 223; Potassium 4.0; Sodium 139; TSH 1.654    Lipid Panel     Component Value Date/Time   CHOL 180 08/20/2020 1155   TRIG 138 08/20/2020 1155   HDL 30 (L) 08/20/2020 1155   CHOLHDL 6.0 (H) 08/20/2020 1155   LDLCALC 125 (H) 08/20/2020 1155     Wt Readings from Last 3 Encounters:  08/09/21  262 lb 6.4 oz (119 kg)  07/04/21 265 lb (120.2 kg)  06/29/21 266 lb 12.1 oz (121 kg)      Other studies Reviewed: Additional studies/ records that were reviewed today include: Cardiac monitor 08/13/2020 personally reviewed Review of the above records today demonstrates:  Rare paroxysmal supraventricular tachycardia.   ASSESSMENT AND PLAN:  1.  PVCs: Currently on flecainide 50 mg twice daily, Toprol-XL 12.5 mg daily.  His PVC burden has gone down on flecainide.  Unfortunately we do not have an accurate representation of his PVC burden.  Due to that, we Tanylah Schnoebelen have him wear a 3-day monitor.  He does have a pending echo as well.  We Reiss Mowrey potentially increase his flecainide after monitoring depending.  Of note, his PVCs appear to be originating from the posterior LV.  2.  SVT: Has had a few episodes with short runs of SVT on cardiac monitoring.  On Toprol-XL.  Case discussed with primary cardiology  Current medicines are reviewed at length with the patient today.   The patient does not have concerns regarding his medicines.  The following changes were made today:  none  Labs/ tests ordered today include:  Orders Placed This Encounter  Procedures   LONG TERM MONITOR (3-14 DAYS)   EKG 12-Lead     Disposition:   FU with Sunset Joshi 6 weeks  Signed, Keivon Garden Jorja Loa, MD  08/09/2021 9:58 AM     [facility] [address] [place] [address] [phone] (office) [phone] (fax).i

## 2021-08-11 ENCOUNTER — Other Ambulatory Visit: Payer: Self-pay

## 2021-08-11 ENCOUNTER — Ambulatory Visit (INDEPENDENT_AMBULATORY_CARE_PROVIDER_SITE_OTHER): Payer: BC Managed Care – PPO

## 2021-08-11 DIAGNOSIS — I493 Ventricular premature depolarization: Secondary | ICD-10-CM

## 2021-08-11 DIAGNOSIS — R079 Chest pain, unspecified: Secondary | ICD-10-CM | POA: Diagnosis not present

## 2021-08-12 LAB — ECHOCARDIOGRAM COMPLETE
Area-P 1/2: 2.62 cm2
S' Lateral: 3.6 cm

## 2021-08-18 ENCOUNTER — Ambulatory Visit: Payer: BC Managed Care – PPO | Admitting: Cardiology

## 2021-08-19 ENCOUNTER — Ambulatory Visit: Payer: BC Managed Care – PPO | Admitting: Cardiology

## 2021-08-19 ENCOUNTER — Other Ambulatory Visit: Payer: Self-pay

## 2021-08-19 ENCOUNTER — Encounter: Payer: Self-pay | Admitting: Cardiology

## 2021-08-19 VITALS — BP 130/80 | HR 75 | Ht 75.0 in | Wt 260.0 lb

## 2021-08-19 DIAGNOSIS — I10 Essential (primary) hypertension: Secondary | ICD-10-CM | POA: Diagnosis not present

## 2021-08-19 DIAGNOSIS — I493 Ventricular premature depolarization: Secondary | ICD-10-CM

## 2021-08-19 DIAGNOSIS — I471 Supraventricular tachycardia: Secondary | ICD-10-CM

## 2021-08-19 MED ORDER — METOPROLOL TARTRATE 100 MG PO TABS: 100.0000 mg | ORAL_TABLET | Freq: Once | ORAL | 0 refills | Status: DC

## 2021-08-19 NOTE — H&P (View-Only) (Signed)
Cardiology Office Note:    Date:  08/19/2021   ID:  Christopher Boone, DOB 12-29-1960, MRN [id number]  PCP:  Gordan Payment., MD  Cardiologist:  Norman Herrlich, MD    Referring MD: Gordan Payment., MD   If frequent PVCs unable to do cardiac CTA. Given the situation with chest pain and ventricular arrhythmia I feel he is best served by coronary angiography. I phoned him reviewed the risk benefits and options he has no dye allergy is normal renal function and he agrees and will undergo coronary angiography this Friday. Last visit I stopped his flecainide was concerns proarrhythmic He has been seen by EP Dr. Elberta Fortis we will continue his beta-blocker. His wife is an Charity fundraiser who is works at [employer] long shift procedure  ASSESSMENT:    1. PVC (premature ventricular contraction)   2. Essential hypertension   3. SVT (supraventricular tachycardia) (HCC)    PLAN:    In order of problems listed above:  He is having new frequent symptomatic PVCs associated chest pain.  Takes flecainide potentially as proarrhythmic of asked him to stop.  I am concerned with his chest pain that he is unrecognized coronary disease will be set up for a facilitated cardiac CTA.  His wife and RN will apply his event monitor today.  Continue his beta-blocker with hypertension plan to see him back in 6 weeks   Next appointment: 6 weeks   Medication Adjustments/Labs and Tests Ordered: Current medicines are reviewed at length with the patient today.  Concerns regarding medicines are outlined above.  Orders Placed This Encounter  Procedures   CT CORONARY MORPH W/CTA COR W/SCORE W/CA W/CM &/OR WO/CM   Basic metabolic panel   Magnesium   EKG 12-Lead   Meds ordered this encounter  Medications   metoprolol tartrate (LOPRESSOR) 100 MG tablet    Sig: Take 1 tablet (100 mg total) by mouth once for 1 dose. Take two hours prior to your cardiac CT    Dispense:  1 tablet    Refill:  0    Chief Complaint  Patient presents  with   Follow-up    History of Present Illness:    Christopher Boone is a 60 y.o. male with a hx of SVT treated with flecainide and hypertension last seen 08/20/2020.  Compliance with diet, lifestyle and medications: Yes  He continues to have bothersome palpitation occurs in the morning and occurs at rest not with physical activity and he has associated chest tightness with his arrhythmia.  He has the mobile cardia device and he shows me symptomatic episodes with frequent but not repetitive PVCs.  His wife is going to attach his event monitor today.  He takes flecainide I think is potentially proarrhythmic and I Ernie Hew have him stop the drug and continue his beta-blocker he has over-the-counter magnesium have asked him to take and we will check a magnesium level today and scheduled for facilitated cardiac CTA.  The PVCs are new and have not been a clinical problem in the past.  He was seen by cardiology with a [facility] long ED visit 06/29/2021 with palpitation and chest pain.  High-sensitivity troponin was normal EKG showed sinus tachycardia PVCs which were frequent on telemetry his chest pain was felt to be nonanginal in nature and is felt to be appropriate for hospital discharge and outpatient stress testing. An echocardiogram 08/11/2021 showing normal left ventricular size mild LVH normal systolic and diastolic function EF 55 to 60%.  Right ventricle is  normal size function no finding of pulmonary hypertension he had no valvular abnormality.  1. Left ventricular ejection fraction, by estimation, is 55 to 60%. The  left ventricle has normal function. The left ventricle has no regional  wall motion abnormalities. There is mild left ventricular hypertrophy.  Left ventricular diastolic parameters  were normal.   2. Right ventricular systolic function is normal. The right ventricular  size is normal.   3. The mitral valve is normal in structure. No evidence of mitral valve  regurgitation. No  evidence of mitral stenosis.   4. The aortic valve is normal in structure. Aortic valve regurgitation is  not visualized. No aortic stenosis is present.   5. The inferior vena cava is normal in size with greater than 50%  respiratory variability, suggesting right atrial pressure of 3 mmHg.  He had a chest CTA pulmonary embolism protocol showing no findings of pulmonary embolism he had scattered thoracic aortic atherosclerosis and no coronary artery calcification.  He was seen in EP consultation Dr. Kemnitz 08/09/2021 with improvement in his PCP burden origin posterior left ventricle was continued on his antiarrhythmic therapy. Past Medical History:  Diagnosis Date   Essential hypertension 06/21/2020   Hypertension    Obesity (BMI 30-39.9) 07/08/2020   SVT (supraventricular tachycardia) (HCC) 05/08/2018    Past Surgical History:  Procedure Laterality Date   KNEE CARTILAGE SURGERY      Current Medications: Current Meds  Medication Sig   acetaminophen (TYLENOL) 500 MG tablet Take 1,000 mg by mouth every 6 (six) hours as needed for mild pain.   metoprolol tartrate (LOPRESSOR) 100 MG tablet Take 1 tablet (100 mg total) by mouth once for 1 dose. Take two hours prior to your cardiac CT   TOPROL XL 25 MG 24 hr tablet Take 0.5 tablets (12.5 mg total) by mouth in the morning and at bedtime.   [DISCONTINUED] flecainide (TAMBOCOR) 50 MG tablet Take 1 tablet (50 mg total) by mouth 2 (two) times daily.     Allergies:   Patient has no known allergies.   Social History   Socioeconomic History   Marital status: Unknown    Spouse name: Not on file   Number of children: Not on file   Years of education: Not on file   Highest education level: Not on file  Occupational History   Not on file  Tobacco Use   Smoking status: Never   Smokeless tobacco: Never  Vaping Use   Vaping Use: Never used  Substance and Sexual Activity   Alcohol use: No   Drug use: Never   Sexual activity: Not on file   Other Topics Concern   Not on file  Social History Narrative   Not on file   Social Determinants of Health   Financial Resource Strain: Not on file  Food Insecurity: Not on file  Transportation Needs: Not on file  Physical Activity: Not on file  Stress: Not on file  Social Connections: Not on file     Family History: The patient's family history includes Arthritis in his father; Cervical cancer in his mother. ROS:   Please see the history of present illness.    All other systems reviewed and are negative.  EKGs/Labs/Other Studies Reviewed:    The following studies were reviewed today:  EKG:  EKG ordered today and personally reviewed.  The ekg ordered today demonstrates Sinus rhythm left atrial abnormality otherwise normal  Recent Labs: 08/20/2020: ALT 29 06/29/2021: BUN 17; Creatinine, Ser 1.00; Hemoglobin   19.9; Magnesium 2.2; Platelets 223; Potassium 4.0; Sodium 139; TSH 1.654  Recent Lipid Panel    Component Value Date/Time   CHOL 180 08/20/2020 1155   TRIG 138 08/20/2020 1155   HDL 30 (L) 08/20/2020 1155   CHOLHDL 6.0 (H) 08/20/2020 1155   LDLCALC 125 (H) 08/20/2020 1155    Physical Exam:    VS:  BP 130/80 (BP Location: Right Arm, Patient Position: Sitting, Cuff Size: Normal)   Pulse 75   Ht 6' 3" (1.905 m)   Wt 260 lb (117.9 kg)   SpO2 98%   BMI 32.50 kg/m     Wt Readings from Last 3 Encounters:  08/19/21 260 lb (117.9 kg)  08/09/21 262 lb 6.4 oz (119 kg)  07/04/21 265 lb (120.2 kg)     GEN:  Well nourished, well developed in no acute distress HEENT: Normal NECK: No JVD; No carotid bruits LYMPHATICS: No lymphadenopathy CARDIAC: RRR, no murmurs, rubs, gallops RESPIRATORY:  Clear to auscultation without rales, wheezing or rhonchi  ABDOMEN: Soft, non-tender, non-distended MUSCULOSKELETAL:  No edema; No deformity  SKIN: Warm and dry NEUROLOGIC:  Alert and oriented x 3 PSYCHIATRIC:  Normal affect    Signed, Skylier Kretschmer, MD  08/19/2021 1:53 PM     [facility]   

## 2021-08-19 NOTE — Progress Notes (Addendum)
Cardiology Office Note:    Date:  08/19/2021   ID:  Christopher Boone, DOB 12-29-1960, MRN [id number]  PCP:  Gordan Payment., MD  Cardiologist:  Norman Herrlich, MD    Referring MD: Gordan Payment., MD   If frequent PVCs unable to do cardiac CTA. Given the situation with chest pain and ventricular arrhythmia I feel he is best served by coronary angiography. I phoned him reviewed the risk benefits and options he has no dye allergy is normal renal function and he agrees and will undergo coronary angiography this Friday. Last visit I stopped his flecainide was concerns proarrhythmic He has been seen by EP Dr. Elberta Fortis we will continue his beta-blocker. His wife is an Charity fundraiser who is works at [employer] long shift procedure  ASSESSMENT:    1. PVC (premature ventricular contraction)   2. Essential hypertension   3. SVT (supraventricular tachycardia) (HCC)    PLAN:    In order of problems listed above:  He is having new frequent symptomatic PVCs associated chest pain.  Takes flecainide potentially as proarrhythmic of asked him to stop.  I am concerned with his chest pain that he is unrecognized coronary disease will be set up for a facilitated cardiac CTA.  His wife and RN will apply his event monitor today.  Continue his beta-blocker with hypertension plan to see him back in 6 weeks   Next appointment: 6 weeks   Medication Adjustments/Labs and Tests Ordered: Current medicines are reviewed at length with the patient today.  Concerns regarding medicines are outlined above.  Orders Placed This Encounter  Procedures   CT CORONARY MORPH W/CTA COR W/SCORE W/CA W/CM &/OR WO/CM   Basic metabolic panel   Magnesium   EKG 12-Lead   Meds ordered this encounter  Medications   metoprolol tartrate (LOPRESSOR) 100 MG tablet    Sig: Take 1 tablet (100 mg total) by mouth once for 1 dose. Take two hours prior to your cardiac CT    Dispense:  1 tablet    Refill:  0    Chief Complaint  Patient presents  with   Follow-up    History of Present Illness:    Christopher Boone is a 60 y.o. male with a hx of SVT treated with flecainide and hypertension last seen 08/20/2020.  Compliance with diet, lifestyle and medications: Yes  He continues to have bothersome palpitation occurs in the morning and occurs at rest not with physical activity and he has associated chest tightness with his arrhythmia.  He has the mobile cardia device and he shows me symptomatic episodes with frequent but not repetitive PVCs.  His wife is going to attach his event monitor today.  He takes flecainide I think is potentially proarrhythmic and I Ernie Hew have him stop the drug and continue his beta-blocker he has over-the-counter magnesium have asked him to take and we will check a magnesium level today and scheduled for facilitated cardiac CTA.  The PVCs are new and have not been a clinical problem in the past.  He was seen by cardiology with a [facility] long ED visit 06/29/2021 with palpitation and chest pain.  High-sensitivity troponin was normal EKG showed sinus tachycardia PVCs which were frequent on telemetry his chest pain was felt to be nonanginal in nature and is felt to be appropriate for hospital discharge and outpatient stress testing. An echocardiogram 08/11/2021 showing normal left ventricular size mild LVH normal systolic and diastolic function EF 55 to 60%.  Right ventricle is  normal size function no finding of pulmonary hypertension he had no valvular abnormality.  1. Left ventricular ejection fraction, by estimation, is 55 to 60%. The  left ventricle has normal function. The left ventricle has no regional  wall motion abnormalities. There is mild left ventricular hypertrophy.  Left ventricular diastolic parameters  were normal.   2. Right ventricular systolic function is normal. The right ventricular  size is normal.   3. The mitral valve is normal in structure. No evidence of mitral valve  regurgitation. No  evidence of mitral stenosis.   4. The aortic valve is normal in structure. Aortic valve regurgitation is  not visualized. No aortic stenosis is present.   5. The inferior vena cava is normal in size with greater than 50%  respiratory variability, suggesting right atrial pressure of 3 mmHg.  He had a chest CTA pulmonary embolism protocol showing no findings of pulmonary embolism he had scattered thoracic aortic atherosclerosis and no coronary artery calcification.  He was seen in EP consultation Dr. Otho Ket 08/09/2021 with improvement in his PCP burden origin posterior left ventricle was continued on his antiarrhythmic therapy. Past Medical History:  Diagnosis Date   Essential hypertension 06/21/2020   Hypertension    Obesity (BMI 30-39.9) 07/08/2020   SVT (supraventricular tachycardia) (HCC) 05/08/2018    Past Surgical History:  Procedure Laterality Date   KNEE CARTILAGE SURGERY      Current Medications: Current Meds  Medication Sig   acetaminophen (TYLENOL) 500 MG tablet Take 1,000 mg by mouth every 6 (six) hours as needed for mild pain.   metoprolol tartrate (LOPRESSOR) 100 MG tablet Take 1 tablet (100 mg total) by mouth once for 1 dose. Take two hours prior to your cardiac CT   TOPROL XL 25 MG 24 hr tablet Take 0.5 tablets (12.5 mg total) by mouth in the morning and at bedtime.   [DISCONTINUED] flecainide (TAMBOCOR) 50 MG tablet Take 1 tablet (50 mg total) by mouth 2 (two) times daily.     Allergies:   Patient has no known allergies.   Social History   Socioeconomic History   Marital status: Unknown    Spouse name: Not on file   Number of children: Not on file   Years of education: Not on file   Highest education level: Not on file  Occupational History   Not on file  Tobacco Use   Smoking status: Never   Smokeless tobacco: Never  Vaping Use   Vaping Use: Never used  Substance and Sexual Activity   Alcohol use: No   Drug use: Never   Sexual activity: Not on file   Other Topics Concern   Not on file  Social History Narrative   Not on file   Social Determinants of Health   Financial Resource Strain: Not on file  Food Insecurity: Not on file  Transportation Needs: Not on file  Physical Activity: Not on file  Stress: Not on file  Social Connections: Not on file     Family History: The patient's family history includes Arthritis in his father; Cervical cancer in his mother. ROS:   Please see the history of present illness.    All other systems reviewed and are negative.  EKGs/Labs/Other Studies Reviewed:    The following studies were reviewed today:  EKG:  EKG ordered today and personally reviewed.  The ekg ordered today demonstrates Sinus rhythm left atrial abnormality otherwise normal  Recent Labs: 08/20/2020: ALT 29 06/29/2021: BUN 17; Creatinine, Ser 1.00; Hemoglobin  19.9; Magnesium 2.2; Platelets 223; Potassium 4.0; Sodium 139; TSH 1.654  Recent Lipid Panel    Component Value Date/Time   CHOL 180 08/20/2020 1155   TRIG 138 08/20/2020 1155   HDL 30 (L) 08/20/2020 1155   CHOLHDL 6.0 (H) 08/20/2020 1155   LDLCALC 125 (H) 08/20/2020 1155    Physical Exam:    VS:  BP 130/80 (BP Location: Right Arm, Patient Position: Sitting, Cuff Size: Normal)   Pulse 75   Ht 6\' 3"  (1.905 m)   Wt 260 lb (117.9 kg)   SpO2 98%   BMI 32.50 kg/m     Wt Readings from Last 3 Encounters:  08/19/21 260 lb (117.9 kg)  08/09/21 262 lb 6.4 oz (119 kg)  07/04/21 265 lb (120.2 kg)     GEN:  Well nourished, well developed in no acute distress HEENT: Normal NECK: No JVD; No carotid bruits LYMPHATICS: No lymphadenopathy CARDIAC: RRR, no murmurs, rubs, gallops RESPIRATORY:  Clear to auscultation without rales, wheezing or rhonchi  ABDOMEN: Soft, non-tender, non-distended MUSCULOSKELETAL:  No edema; No deformity  SKIN: Warm and dry NEUROLOGIC:  Alert and oriented x 3 PSYCHIATRIC:  Normal affect    Signed, 07/06/21, MD  08/19/2021 1:53 PM     [facility]

## 2021-08-19 NOTE — Patient Instructions (Signed)
Medication Instructions:  Your physician has recommended you make the following change in your medication:  STOP: Flecainide  *If you need a refill on your cardiac medications before your next appointment, please call your pharmacy*   Lab Work: Your physician recommends that you return for lab work in: TODAY BMP, Mag If you have labs (blood work) drawn today and your tests are completely normal, you will receive your results only by: MyChart Message (if you have MyChart) OR A paper copy in the mail If you have any lab test that is abnormal or we need to change your treatment, we will call you to review the results.   Testing/Procedures:   Your cardiac CT will be scheduled at the below location:   [facility] [address] [place], [place] [address] [phone]  If scheduled at [facility], please arrive at the [facility] main entrance (entrance A) of [facility] 30 minutes prior to test start time. Proceed to the [facility] Radiology Department (first floor) to check-in and test prep.  Please follow these instructions carefully (unless otherwise directed):  On the Night Before the Test: Be sure to Drink plenty of water. Do not consume any caffeinated/decaffeinated beverages or chocolate 12 hours prior to your test. Do not take any antihistamines 12 hours prior to your test  On the Day of the Test: Drink plenty of water until 1 hour prior to the test. Do not eat any food 4 hours prior to the test. You may take your regular medications prior to the test.  Take metoprolol (Lopressor) two hours prior to test.  After the Test: Drink plenty of water. After receiving IV contrast, you may experience a mild flushed feeling. This is normal. On occasion, you may experience a mild rash up to 24 hours after the test. This is not dangerous. If this occurs, you can take Benadryl 25 mg and increase your fluid intake. If you experience trouble breathing,  this can be serious. If it is severe call 911 IMMEDIATELY. If it is mild, please call our office. If you take any of these medications: Glipizide/Metformin, Avandament, Glucavance, please do not take 48 hours after completing test unless otherwise instructed.  Please allow 2-4 weeks for scheduling of routine cardiac CTs. Some insurance companies require a pre-authorization which may delay scheduling of this test.   For non-scheduling related questions, please contact the cardiac imaging nurse navigator should you have any questions/concerns: Rockwell Alexandria, Cardiac Imaging Nurse Navigator Larey Brick, Cardiac Imaging Nurse Navigator [facility] Direct Office Dial: [phone]   For scheduling needs, including cancellations and rescheduling, please call [place], [phone].    Follow-Up: At [facility], you and your health needs are our priority.  As part of our continuing mission to provide you with exceptional heart care, we have created designated Provider Care Teams.  These Care Teams include your primary Cardiologist (physician) and Advanced Practice Providers (APPs -  Physician Assistants and Nurse Practitioners) who all work together to provide you with the care you need, when you need it.  We recommend signing up for the patient portal called "MyChart".  Sign up information is provided on this After Visit Summary.  MyChart is used to connect with patients for Virtual Visits (Telemedicine).  Patients are able to view lab/test results, encounter notes, upcoming appointments, etc.  Non-urgent messages can be sent to your provider as well.   To learn more about what you can do with MyChart,  go to [email or web address].    Your next appointment:   6 week(s)  The format for your next appointment:   In Person  Provider:   Norman Herrlich, MD   Other Instructions

## 2021-08-20 LAB — BASIC METABOLIC PANEL
BUN/Creatinine Ratio: 14 (ref 10–24)
BUN: 14 mg/dL (ref 8–27)
CO2: 20 mmol/L (ref 20–29)
Calcium: 8.7 mg/dL (ref 8.6–10.2)
Chloride: 104 mmol/L (ref 96–106)
Creatinine, Ser: 1.02 mg/dL (ref 0.76–1.27)
Glucose: 91 mg/dL (ref 70–99)
Potassium: 4.5 mmol/L (ref 3.5–5.2)
Sodium: 141 mmol/L (ref 134–144)
eGFR: 84 mL/min/{1.73_m2} (ref >59)

## 2021-08-20 LAB — MAGNESIUM: Magnesium: 2.1 mg/dL (ref 1.6–2.3)

## 2021-08-22 ENCOUNTER — Telehealth: Payer: Self-pay

## 2021-08-22 NOTE — Telephone Encounter (Signed)
Spoke with patient regarding results and recommendation.  Patient verbalizes understanding and is agreeable to plan of care. Advised patient to call back with any issues or concerns.  

## 2021-08-22 NOTE — Telephone Encounter (Signed)
-----   Message from Baldo Daub, MD sent at 08/22/2021 12:15 PM EDT ----- Normal results

## 2021-08-24 ENCOUNTER — Other Ambulatory Visit: Payer: Self-pay

## 2021-08-24 ENCOUNTER — Telehealth (HOSPITAL_COMMUNITY): Payer: Self-pay | Admitting: *Deleted

## 2021-08-24 MED ORDER — METOPROLOL TARTRATE 100 MG PO TABS: 100.0000 mg | ORAL_TABLET | Freq: Once | ORAL | 0 refills | Status: DC

## 2021-08-24 MED ORDER — TOPROL XL 25 MG PO TB24
12.5000 mg | ORAL_TABLET | Freq: Two times a day (BID) | ORAL | 3 refills | Status: DC
Start: 2021-08-24 — End: 2021-09-13

## 2021-08-24 NOTE — Telephone Encounter (Signed)
Attempted to call patient regarding upcoming cardiac CT appointment. °Left message on voicemail with name and callback number ° °Khriz Liddy RN Navigator Cardiac Imaging °[facility] °[phone] Office °[phone] Cell ° °

## 2021-08-25 ENCOUNTER — Telehealth (HOSPITAL_COMMUNITY): Payer: Self-pay | Admitting: *Deleted

## 2021-08-25 NOTE — Telephone Encounter (Signed)
Reaching out to patient to offer assistance regarding upcoming cardiac imaging study; pt verbalizes understanding of appt date/time, parking situation and where to check in, pre-test NPO status and medications ordered; name and call back number provided for further questions should they arise  Lametria Klunk RN Navigator Cardiac Imaging [facility] [phone] office [phone] cell  Patient to take 100mg metoprolol tartrate two hours prior to cardiac CT scan. 

## 2021-08-26 ENCOUNTER — Other Ambulatory Visit: Payer: Self-pay

## 2021-08-26 ENCOUNTER — Ambulatory Visit (HOSPITAL_COMMUNITY)
Admission: RE | Admit: 2021-08-26 | Discharge: 2021-08-26 | Disposition: A | Discharge status: Home / Self Care | Payer: BC Managed Care – PPO | Source: Ambulatory Visit | Attending: Cardiology | Admitting: Cardiology

## 2021-08-26 ENCOUNTER — Ambulatory Visit (HOSPITAL_COMMUNITY): Payer: BC Managed Care – PPO

## 2021-08-26 NOTE — Progress Notes (Signed)
Pt in trigeminy. Blood pressure stable. Dr. Bing Matter notified. Per MD cancel CT at this time. [place] Dec CT navigator informed.

## 2021-08-29 ENCOUNTER — Telehealth: Payer: Self-pay

## 2021-08-29 DIAGNOSIS — I471 Supraventricular tachycardia, unspecified: Secondary | ICD-10-CM

## 2021-08-29 DIAGNOSIS — I10 Essential (primary) hypertension: Secondary | ICD-10-CM

## 2021-08-29 NOTE — Addendum Note (Signed)
Addended by: Norman Herrlich on: 08/29/2021 08:00 AM   Modules accepted: Orders, SmartSet

## 2021-08-29 NOTE — Telephone Encounter (Signed)
-----   Message from Baldo Daub, MD sent at 08/28/2021 11:30 AM EDT ----- Please contact him the PVCs are going to be a problem with imaging both CT nuclear medicine I think he should undergo coronary angiogram.  If he needs come in in the office to see me schedule otherwise I will go ahead and schedule his coronary angiogram and do a phone call informed consent. ----- Message ----- From: Lennie Odor, RN Sent: 08/26/2021   4:46 PM EDT To: Georgeanna Lea, MD, Baldo Daub, MD, #  Hey guys,  Letting you know this patients scan was aborted today due to his heart rhythm.  Please let me know how youd like to proceed (re-attempt after adjusting meds vs different modality) Have a great weekend Rockwell Alexandria

## 2021-08-29 NOTE — Telephone Encounter (Signed)
[facility] CARDIOVASCULAR DIVISION [facility] [place] [address] [place] [address] Dept: [phone] Loc: [phone]  Avid Guillette  08/29/2021  You are scheduled for a Cardiac Catheterization on Friday, October 14 with Dr.  Lynnette Caffey .  1. Please arrive at the [facility] (Main Entrance A) at [facility]: [address] [place], [place] [address] at 5:30 AM (This time is two hours before your procedure to ensure your preparation). Free valet parking service is available.   Special note: Every effort is made to have your procedure done on time. Please understand that emergencies sometimes delay scheduled procedures.  2. Diet: Do not eat solid foods after midnight.  The patient may have clear liquids until 5am upon the day of the procedure.  3. Labs: YOU WILL NEED YOUR LABS DRAWN ON 08/29/2021-08/31/2021  4. Medication instructions in preparation for your procedure:   Contrast Allergy: No  On the morning of your procedure, take your Aspirin and any morning medicines NOT listed above.  You may use sips of water.  5. Plan for one night stay--bring personal belongings. 6. Bring a current list of your medications and current insurance cards. 7. You MUST have a responsible person to drive you home. 8. Someone MUST be with you the first 24 hours after you arrive home or your discharge will be delayed. 9. Please wear clothes that are easy to get on and off and wear slip-on shoes.  Thank you for allowing [place] to care for you!   -- [facility]

## 2021-08-30 ENCOUNTER — Telehealth: Payer: Self-pay

## 2021-08-30 LAB — CBC
Hematocrit: 56.5 % — ABNORMAL HIGH (ref 37.5–51.0)
Hemoglobin: 19.1 g/dL — ABNORMAL HIGH (ref 13.0–17.7)
MCH: 31.9 pg (ref 26.6–33.0)
MCHC: 33.8 g/dL (ref 31.5–35.7)
MCV: 95 fL (ref 79–97)
Platelets: 193 10*3/uL (ref 150–450)
RBC: 5.98 x10E6/uL — ABNORMAL HIGH (ref 4.14–5.80)
RDW: 13.4 % (ref 11.6–15.4)
WBC: 8.8 10*3/uL (ref 3.4–10.8)

## 2021-08-30 LAB — BASIC METABOLIC PANEL
BUN/Creatinine Ratio: 13 (ref 10–24)
BUN: 13 mg/dL (ref 8–27)
CO2: 17 mmol/L — ABNORMAL LOW (ref 20–29)
Calcium: 8.6 mg/dL (ref 8.6–10.2)
Chloride: 105 mmol/L (ref 96–106)
Creatinine, Ser: 1.01 mg/dL (ref 0.76–1.27)
Glucose: 80 mg/dL (ref 70–99)
Potassium: 4.4 mmol/L (ref 3.5–5.2)
Sodium: 139 mmol/L (ref 134–144)
eGFR: 85 mL/min/{1.73_m2} (ref >59)

## 2021-08-30 NOTE — Telephone Encounter (Signed)
-----   Message from Baldo Daub, MD sent at 08/30/2021  7:49 AM EDT ----- Peri [place] results for heart catheterization  His hematocrit is mildly elevated following his coronary angiogram he should talk to his family physician about this.

## 2021-08-30 NOTE — Telephone Encounter (Signed)
Spoke with patient regarding results and recommendation.  Patient verbalizes understanding and is agreeable to plan of care. Advised patient to call back with any issues or concerns.  

## 2021-08-31 ENCOUNTER — Telehealth: Payer: Self-pay | Admitting: *Deleted

## 2021-08-31 DIAGNOSIS — D751 Secondary polycythemia: Secondary | ICD-10-CM | POA: Insufficient documentation

## 2021-08-31 DIAGNOSIS — R079 Chest pain, unspecified: Secondary | ICD-10-CM | POA: Insufficient documentation

## 2021-08-31 HISTORY — DX: Secondary polycythemia: D75.1

## 2021-08-31 HISTORY — DX: Chest pain, unspecified: R07.9

## 2021-08-31 NOTE — Telephone Encounter (Signed)
Cardiac catheterization scheduled at [facility] for: September 02, 2021 7:30 AM [facility] : [facility] Main Entrance A [facility]) at: 5:30 AM   No solid food after midnight prior to cath, clear liquids until 5 AM day of procedure.   Usual morning medications can be taken pre-cath with sips of water including aspirin 81 mg.    Confirmed patient has responsible adult to drive home post procedure and be with patient first 24 hours after arriving home.  [facility] does allow one visitor to accompany you and wait in the hospital waiting room while you are there for your procedure. You and your visitor will be asked to wear a mask once you enter the hospital.   Patient reports does not currently have any symptoms concerning for COVID-19 and no household members with COVID-19 like illness.   Reviewed procedure/mask/visitor instructions with patient.

## 2021-09-02 ENCOUNTER — Encounter (HOSPITAL_COMMUNITY): Payer: Self-pay | Admitting: Internal Medicine

## 2021-09-02 ENCOUNTER — Ambulatory Visit (HOSPITAL_COMMUNITY)
Admission: RE | Disposition: A | Discharge status: Home / Self Care | Payer: Self-pay | Source: Ambulatory Visit | Attending: Internal Medicine

## 2021-09-02 ENCOUNTER — Ambulatory Visit (HOSPITAL_COMMUNITY)
Admission: RE | Admit: 2021-09-02 | Discharge: 2021-09-02 | Disposition: A | Discharge status: Home / Self Care | Payer: BC Managed Care – PPO | Source: Ambulatory Visit | Attending: Internal Medicine | Admitting: Internal Medicine

## 2021-09-02 ENCOUNTER — Other Ambulatory Visit: Payer: Self-pay

## 2021-09-02 DIAGNOSIS — E669 Obesity, unspecified: Secondary | ICD-10-CM | POA: Diagnosis not present

## 2021-09-02 DIAGNOSIS — Z6832 Body mass index (BMI) 32.0-32.9, adult: Secondary | ICD-10-CM | POA: Diagnosis not present

## 2021-09-02 DIAGNOSIS — R0789 Other chest pain: Secondary | ICD-10-CM | POA: Insufficient documentation

## 2021-09-02 DIAGNOSIS — Z79899 Other long term (current) drug therapy: Secondary | ICD-10-CM | POA: Insufficient documentation

## 2021-09-02 DIAGNOSIS — I471 Supraventricular tachycardia: Secondary | ICD-10-CM | POA: Insufficient documentation

## 2021-09-02 DIAGNOSIS — I493 Ventricular premature depolarization: Secondary | ICD-10-CM | POA: Diagnosis not present

## 2021-09-02 DIAGNOSIS — I2510 Atherosclerotic heart disease of native coronary artery without angina pectoris: Secondary | ICD-10-CM | POA: Diagnosis not present

## 2021-09-02 DIAGNOSIS — R079 Chest pain, unspecified: Secondary | ICD-10-CM

## 2021-09-02 DIAGNOSIS — I10 Essential (primary) hypertension: Secondary | ICD-10-CM | POA: Diagnosis not present

## 2021-09-02 HISTORY — PX: LEFT HEART CATH AND CORONARY ANGIOGRAPHY: CATH118249

## 2021-09-02 SURGERY — LEFT HEART CATH AND CORONARY ANGIOGRAPHY
Anesthesia: LOCAL

## 2021-09-02 MED ORDER — SODIUM CHLORIDE 0.9 % IV SOLN: 250.0000 mL | INTRAVENOUS | Status: DC | PRN

## 2021-09-02 MED ORDER — SODIUM CHLORIDE 0.9 % WEIGHT BASED INFUSION: 1.0000 mL/kg/h | INTRAVENOUS | Status: DC

## 2021-09-02 MED ORDER — IOHEXOL 350 MG/ML SOLN
INTRAVENOUS | Status: DC | PRN
  Administered 2021-09-02: 35 mL

## 2021-09-02 MED ORDER — LABETALOL HCL 5 MG/ML IV SOLN: 10.0000 mg | INTRAVENOUS | Status: DC | PRN

## 2021-09-02 MED ORDER — MIDAZOLAM HCL 2 MG/2ML IJ SOLN
INTRAMUSCULAR | Status: DC | PRN
  Administered 2021-09-02: 1 mg via INTRAVENOUS

## 2021-09-02 MED ORDER — HYDRALAZINE HCL 20 MG/ML IJ SOLN: 10.0000 mg | INTRAMUSCULAR | Status: DC | PRN

## 2021-09-02 MED ORDER — VERAPAMIL HCL 2.5 MG/ML IV SOLN
INTRAVENOUS | Status: AC
  Filled 2021-09-02: qty 2

## 2021-09-02 MED ORDER — LIDOCAINE HCL (PF) 1 % IJ SOLN
INTRAMUSCULAR | Status: AC
  Filled 2021-09-02: qty 30

## 2021-09-02 MED ORDER — ONDANSETRON HCL 4 MG/2ML IJ SOLN: 4.0000 mg | Freq: Four times a day (QID) | INTRAMUSCULAR | Status: DC | PRN

## 2021-09-02 MED ORDER — ACETAMINOPHEN 325 MG PO TABS: 650.0000 mg | ORAL_TABLET | ORAL | Status: DC | PRN

## 2021-09-02 MED ORDER — MIDAZOLAM HCL 2 MG/2ML IJ SOLN
INTRAMUSCULAR | Status: AC
  Filled 2021-09-02: qty 2

## 2021-09-02 MED ORDER — SODIUM CHLORIDE 0.9% FLUSH: 3.0000 mL | Freq: Two times a day (BID) | INTRAVENOUS | Status: DC

## 2021-09-02 MED ORDER — HEPARIN SODIUM (PORCINE) 1000 UNIT/ML IJ SOLN
INTRAMUSCULAR | Status: AC
  Filled 2021-09-02: qty 1

## 2021-09-02 MED ORDER — LIDOCAINE HCL (PF) 1 % IJ SOLN
INTRAMUSCULAR | Status: DC | PRN
  Administered 2021-09-02: 2 mL

## 2021-09-02 MED ORDER — ASPIRIN 81 MG PO CHEW: 81.0000 mg | CHEWABLE_TABLET | ORAL | Status: DC

## 2021-09-02 MED ORDER — HEPARIN (PORCINE) IN NACL 1000-0.9 UT/500ML-% IV SOLN
INTRAVENOUS | Status: DC | PRN
  Administered 2021-09-02 (×2): 500 mL

## 2021-09-02 MED ORDER — HEPARIN SODIUM (PORCINE) 1000 UNIT/ML IJ SOLN
INTRAMUSCULAR | Status: DC | PRN
  Administered 2021-09-02: 5000 [IU] via INTRAVENOUS

## 2021-09-02 MED ORDER — VERAPAMIL HCL 2.5 MG/ML IV SOLN
INTRAVENOUS | Status: DC | PRN
  Administered 2021-09-02: 10 mL via INTRA_ARTERIAL

## 2021-09-02 MED ORDER — FENTANYL CITRATE (PF) 100 MCG/2ML IJ SOLN
INTRAMUSCULAR | Status: DC | PRN
  Administered 2021-09-02: 25 ug via INTRAVENOUS

## 2021-09-02 MED ORDER — FENTANYL CITRATE (PF) 100 MCG/2ML IJ SOLN
INTRAMUSCULAR | Status: AC
  Filled 2021-09-02: qty 2

## 2021-09-02 MED ORDER — HEPARIN (PORCINE) IN NACL 1000-0.9 UT/500ML-% IV SOLN
INTRAVENOUS | Status: AC
  Filled 2021-09-02: qty 1000

## 2021-09-02 MED ORDER — SODIUM CHLORIDE 0.9% FLUSH: 3.0000 mL | INTRAVENOUS | Status: DC | PRN

## 2021-09-02 MED ORDER — SODIUM CHLORIDE 0.9 % WEIGHT BASED INFUSION
3.0000 mL/kg/h | INTRAVENOUS | Status: AC
  Administered 2021-09-02: 3 mL/kg/h via INTRAVENOUS

## 2021-09-02 SURGICAL SUPPLY — 9 items
CATH DIAG 6FR JR4 (CATHETERS) ×1 IMPLANT
CATH DIAG 6FR PIGTAIL ANGLED (CATHETERS) ×1 IMPLANT
CATH INFINITI 6F FL3.5 (CATHETERS) ×1 IMPLANT
DEVICE RAD COMP TR BAND LRG (VASCULAR PRODUCTS) ×2 IMPLANT
GLIDESHEATH SLEND SS 6F .021 (SHEATH) ×1 IMPLANT
PACK CARDIAC CATHETERIZATION (CUSTOM PROCEDURE TRAY) ×1 IMPLANT
TRANSDUCER W/STOPCOCK (MISCELLANEOUS) ×1 IMPLANT
TUBING CIL FLEX 10 FLL-RA (TUBING) ×1 IMPLANT
WIRE EMERALD 3MM-J .035X260CM (WIRE) ×2 IMPLANT

## 2021-09-02 NOTE — Progress Notes (Signed)
Pt ambulated without difficulty or bleeding.   Discharged home with his wife who will drive and stay with pt x 24 hrs. 

## 2021-09-02 NOTE — Discharge Instructions (Signed)
Radial Site Care  This sheet gives you information about how to care for yourself after your procedure. Your health care provider may also give you more specific instructions. If you have problems or questions, contact your health care provider. What can I expect after the procedure? After the procedure, it is common to have: Bruising and tenderness at the catheter insertion area. Follow these instructions at home: Medicines Take over-the-counter and prescription medicines only as told by your health care provider. Insertion site care Follow instructions from your health care provider about how to take care of your insertion site. Make sure you: Wash your hands with soap and water before you remove your bandage (dressing). If soap and water are not available, use hand sanitizer. May remove dressing in 24 hours. Check your insertion site every day for signs of infection. Check for: Redness, swelling, or pain. Fluid or blood. Pus or a bad smell. Warmth. Do no take baths, swim, or use a hot tub for 5 days. You may shower 24-48 hours after the procedure. Remove the dressing and gently wash the site with plain soap and water. Pat the area dry with a clean towel. Do not rub the site. That could cause bleeding. Do not apply powder or lotion to the site. Activity  For 24 hours after the procedure, or as directed by your health care provider: Do not flex or bend the affected arm. Do not push or pull heavy objects with the affected arm. Do not drive yourself home from the hospital or clinic. You may drive 24 hours after the procedure. Do not operate machinery or power tools. KEEP ARM ELEVATED THE REMAINDER OF THE DAY. Do not push, pull or lift anything that is heavier than 10 lb for 5 days. Ask your health care provider when it is okay to: Return to work or school. Resume usual physical activities or sports. Resume sexual activity. General instructions If the catheter site starts to  bleed, raise your arm and put firm pressure on the site. If the bleeding does not stop, get help right away. This is a medical emergency. DRINK PLENTY OF FLUIDS FOR THE NEXT 2-3 DAYS. No alcohol consumption for 24 hours after receiving sedation. If you went home on the same day as your procedure, a responsible adult should be with you for the first 24 hours after you arrive home. Keep all follow-up visits as told by your health care provider. This is important. Contact a health care provider if: You have a fever. You have redness, swelling, or yellow drainage around your insertion site. Get help right away if: You have unusual pain at the radial site. The catheter insertion area swells very fast. The insertion area is bleeding, and the bleeding does not stop when you hold steady pressure on the area. Your arm or hand becomes pale, cool, tingly, or numb. These symptoms may represent a serious problem that is an emergency. Do not wait to see if the symptoms will go away. Get medical help right away. Call your local emergency services (911 in the [place] Do not drive yourself to the hospital. Summary After the procedure, it is common to have bruising and tenderness at the site. Follow instructions from your health care provider about how to take care of your radial site wound. Check the wound every day for signs of infection.  This information is not intended to replace advice given to you by your health care provider. Make sure you discuss any questions you have with   your health care provider. Document Revised: 12/12/2017 Document Reviewed: 12/12/2017 Elsevier Patient Education  2020 Elsevier Inc.  

## 2021-09-02 NOTE — Interval H&P Note (Signed)
History and Physical Interval Note:  09/02/2021 6:42 AM  Christopher Boone  has presented today for surgery, with the diagnosis of chest pain.  The various methods of treatment have been discussed with the patient and family. After consideration of risks, benefits and other options for treatment, the patient has consented to  Procedure(s): LEFT HEART CATH AND CORONARY ANGIOGRAPHY (N/A) as a surgical intervention.  The patient's history has been reviewed, patient examined, no change in status, stable for surgery.  I have reviewed the patient's chart and labs.  Questions were answered to the patient's satisfaction.    Cath Lab Visit (complete for each Cath Lab visit)  Clinical Evaluation Leading to the Procedure:   ACS: No.  Non-ACS:    Anginal Classification: CCS I  Anti-ischemic medical therapy: Minimal Therapy (1 class of medications)  Non-Invasive Test Results: No non-invasive testing performed  Prior CABG: No previous CABG        Orbie Pyo

## 2021-09-05 MED FILL — Heparin Sod (Porcine)-NaCl IV Soln 1000 Unit/500ML-0.9%: INTRAVENOUS | Qty: 500 | Status: AC

## 2021-09-13 ENCOUNTER — Telehealth: Payer: Self-pay

## 2021-09-13 MED ORDER — METOPROLOL SUCCINATE ER 25 MG PO TB24: 12.5000 mg | ORAL_TABLET | Freq: Two times a day (BID) | ORAL | 1 refills | Status: DC

## 2021-09-13 NOTE — Telephone Encounter (Signed)
Monico Blitz, MD  You 4 hours ago (11:54 AM)   yes    You  Baldo Daub, MD 4 hours ago (11:44 AM)   Good morning, I received a refill request  however the pharmacy is requesting to change Toprol XL 25mg  to be changed to it's generic form. Is this request ok to proceed?   Metoprolol Succinate 12.5 bid sent per Dr. approval . Rx sent

## 2021-10-04 ENCOUNTER — Encounter: Payer: Self-pay | Admitting: Cardiology

## 2021-10-04 ENCOUNTER — Other Ambulatory Visit: Payer: Self-pay

## 2021-10-04 ENCOUNTER — Ambulatory Visit: Payer: BC Managed Care – PPO | Admitting: Cardiology

## 2021-10-04 VITALS — BP 120/80 | HR 65 | Resp 18 | Ht 75.0 in | Wt 260.0 lb

## 2021-10-04 DIAGNOSIS — I493 Ventricular premature depolarization: Secondary | ICD-10-CM | POA: Diagnosis not present

## 2021-10-04 NOTE — Progress Notes (Signed)
Electrophysiology Office Note   Date:  10/04/2021   ID:  LENVIL Christopher Boone, DOB Oct 07, 1961, MRN [id number]  PCP:  Gordan Payment., MD  Cardiologist:  Dulce Sellar Primary Electrophysiologist: Nakshatra Klose Jorja Loa, MD    Chief Complaint: Palpitations   History of Present Illness: Christopher Christopher Boone is a 60 y.o. male who is being seen today for the evaluation of PVCs at the request of Gordan Payment., MD. Presenting today for electrophysiology evaluation.  He has a history significant for SVT, hypertension, obesity.  He presented emergency room 06/29/2021 with chest pain.  He had a left heart catheterization that showed no evidence of coronary artery disease.  He has had recurrent episodes of SVT, most recently August 2021.  He wore a Zio patch which showed short runs of SVT as well as PVCs.  His most recent cardiac monitor showed a 20% PVC burden.  Today, denies symptoms of palpitations, chest pain, shortness of breath, orthopnea, PND, lower extremity edema, claudication, dizziness, presyncope, syncope, bleeding, or neurologic sequela. The patient is tolerating medications without difficulties.  Since being seen he has done well.  He has had no chest pain or shortness of breath.  His palpitations have greatly improved.  He states that he continues with dose of his metoprolol and his heart rate was going slow.  He is also been taking a pill and a half of flecainide in the evenings.  He states that he no longer has shortness of breath or fatigue.  He checks his rhythm on his cardia mobile and it has always been regular since adjusting his flecainide dose.  He is overall comfortable with his control and does not wish to have any further changes in management.   Past Medical History:  Diagnosis Date   Essential hypertension 06/21/2020   Hypertension    Obesity (BMI 30-39.9) 07/08/2020   SVT (supraventricular tachycardia) (HCC) 05/08/2018   Past Surgical History:  Procedure Laterality Date   KNEE CARTILAGE  SURGERY     LEFT HEART CATH AND CORONARY ANGIOGRAPHY N/A 09/02/2021   Procedure: LEFT HEART CATH AND CORONARY ANGIOGRAPHY;  Surgeon: Orbie Pyo, MD;  Location: [facility];  Service: Cardiovascular;  Laterality: N/A;     Current Outpatient Medications  Medication Sig Dispense Refill   acetaminophen (TYLENOL) 500 MG tablet Take 1,000 mg by mouth every 6 (six) hours as needed for mild pain.     aspirin EC 81 MG tablet Take 81 mg by mouth daily. Swallow whole.     Coenzyme Q10 (COQ10) 100 MG CAPS Take 100 mg by mouth daily.     flecainide (TAMBOCOR) 100 MG tablet Take 25 mg by mouth daily as needed. Took 1/2 tab 09/02/21     MAGNESIUM PO Take 1 packet by mouth daily.     metoprolol succinate (TOPROL XL) 25 MG 24 hr tablet Take 0.5 tablets (12.5 mg total) by mouth 2 (two) times daily. 180 tablet 1   Taurine POWD Take 1 Scoop by mouth daily.     No current facility-administered medications for this visit.    Allergies:   Patient has no known allergies.   Social History:  The patient  reports that he has never smoked. He has never used smokeless tobacco. He reports that he does not drink alcohol and does not use drugs.   Family History:  The patient's family history includes Arthritis in his father; Cervical cancer in his mother.   ROS:  Please see the history of present  illness.   Otherwise, review of systems is positive for none.   All other systems are reviewed and negative.   PHYSICAL EXAM: VS:  BP 120/80   Pulse 65   Resp 18   Ht 6\' 3"  (1.905 m)   Wt 260 lb (117.9 kg)   SpO2 98%   BMI 32.50 kg/m  , BMI Body mass index is 32.5 kg/m. GEN: Well nourished, well developed, in no acute distress  HEENT: normal  Neck: no JVD, carotid bruits, or masses Cardiac: RRR; no murmurs, rubs, or gallops,no edema  Respiratory:  clear to auscultation bilaterally, normal work of breathing GI: soft, nontender, nondistended, + BS MS: no deformity or atrophy  Skin: warm and  dry Neuro:  Strength and sensation are intact Psych: euthymic mood, full affect  EKG:  EKG is ordered today. Personal review of the ekg ordered shows sinus rhythm, rate 65  Recent Labs: 06/29/2021: TSH 1.654 08/19/2021: Magnesium 2.1 08/29/2021: BUN 13; Creatinine, Ser 1.01; Hemoglobin 19.1; Platelets 193; Potassium 4.4; Sodium 139    Lipid Panel     Component Value Date/Time   CHOL 180 08/20/2020 1155   TRIG 138 08/20/2020 1155   HDL 30 (L) 08/20/2020 1155   CHOLHDL 6.0 (H) 08/20/2020 1155   LDLCALC 125 (H) 08/20/2020 1155     Wt Readings from Last 3 Encounters:  10/04/21 260 lb (117.9 kg)  09/02/21 250 lb (113.4 kg)  08/19/21 260 lb (117.9 kg)      Other studies Reviewed: Additional studies/ records that were reviewed today include: TTE 08/12/2021 Review of the above records today demonstrates:   1. Left ventricular ejection fraction, by estimation, is 55 to 60%. The  left ventricle has normal function. The left ventricle has no regional  wall motion abnormalities. There is mild left ventricular hypertrophy.  Left ventricular diastolic parameters  were normal.   2. Right ventricular systolic function is normal. The right ventricular  size is normal.   3. The mitral valve is normal in structure. No evidence of mitral valve  regurgitation. No evidence of mitral stenosis.   4. The aortic valve is normal in structure. Aortic valve regurgitation is  not visualized. No aortic stenosis is present.   5. The inferior vena cava is normal in size with greater than 50%  respiratory variability, suggesting right atrial pressure of 3 mmHg.   Cardiac monitor 08/26/2021 personally reviewed Predominant underlying rhythm was sinus rhythm Less than 1% supraventricular ectopy 20.4% ventricular ectopy No atrial fibrillation noted Symptoms associated with ventricular ectopy  Left heart cath 09/02/2021   Prox LAD lesion is 10% stenosed.   LV end diastolic pressure is  normal.  ASSESSMENT AND PLAN:  1.  PVCs: Currently on flecainide 50 mg in the morning and 75 mg in the evening.  High risk medication monitoring for flecainide.  ECG today.  PVC burden most recently is at 20.4%.  Since increasing his flecainide dose, he has had much improved PVC burden.  He no longer has fatigue.  He checks his heart rhythm on his cardia mobile multiple times with a stable rhythm.  We Su Duma continue with current management.  2.  SVT: No further symptoms.  Feeling well.  Continue with current management as above.   Current medicines are reviewed at length with the patient today.   The patient does not have concerns regarding his medicines.  The following changes were made today: None  Labs/ tests ordered today include:  Orders Placed This Encounter  Procedures  EKG 12-Lead      Disposition:   FU with Essance Gatti 6month  Signed, Dalia Jollie Meredith Leeds, MD  10/04/2021 3:53 PM     [facility] [address] [place] [address] [phone] (office) [phone] (fax).i

## 2021-10-07 ENCOUNTER — Other Ambulatory Visit: Payer: Self-pay

## 2021-10-07 ENCOUNTER — Ambulatory Visit: Payer: BC Managed Care – PPO | Admitting: Cardiology

## 2021-10-07 ENCOUNTER — Encounter: Payer: Self-pay | Admitting: Cardiology

## 2021-10-07 VITALS — BP 144/79 | HR 78 | Ht 75.0 in | Wt 260.0 lb

## 2021-10-07 DIAGNOSIS — I10 Essential (primary) hypertension: Secondary | ICD-10-CM | POA: Diagnosis not present

## 2021-10-07 DIAGNOSIS — Z79899 Other long term (current) drug therapy: Secondary | ICD-10-CM

## 2021-10-07 DIAGNOSIS — I493 Ventricular premature depolarization: Secondary | ICD-10-CM | POA: Diagnosis not present

## 2021-10-07 DIAGNOSIS — I471 Supraventricular tachycardia: Secondary | ICD-10-CM

## 2021-10-07 NOTE — Progress Notes (Signed)
Cardiology Office Note:    Date:  10/07/2021   ID:  Christopher Boone, DOB Oct 10, 1961, MRN [id number]  PCP:  Gordan Payment., MD  Cardiologist:  Norman Herrlich, MD    Referring MD: Gordan Payment., MD    ASSESSMENT:    1. PVC's (premature ventricular contractions)   2. High risk medication use   3. SVT (supraventricular tachycardia) (HCC)   4. Essential hypertension    PLAN:    In order of problems listed above:  He is doing much better arrhythmia suppressed with flecainide beta-blocker heart rate blood pressure controlled continue current medications check flecainide level we will see back in office in 6 months.   Next appointment: 6 months   Medication Adjustments/Labs and Tests Ordered: Current medicines are reviewed at length with the patient today.  Concerns regarding medicines are outlined above.  Orders Placed This Encounter  Procedures   Flecainide level   No orders of the defined types were placed in this encounter.   Chief Complaint  Patient presents with   Follow-up    PVC's   .[facility]arrehab  History of Present Illness:    Christopher Boone is a 60 y.o. male with a hx of history of SVT hypertension and frequent symptomatic PVCs with a 20% burden last seen by EP consultant 10/04/2021.  He was initiated on antiarrhythmic drug therapy with flecainide.. Compliance with diet, lifestyle and medications: Yes  Is taking lower dose beta-blocker and his flecainide and is doing very well with minimal palpitation He wonders if mRNA vaccine may have precipitated his arrhythmia I certainly think it is possible No chest pain edema shortness of breath or syncope The request of flecainide level   Heart catheterization 09/02/2021: Procedures  LEFT HEART CATH AND CORONARY ANGIOGRAPHY   Conclusion      Prox LAD lesion is 10% stenosed.   LV end diastolic pressure is normal.   1.  Very mild obstructive coronary artery disease 2.  Normal LVEDP Past Medical History:   Diagnosis Date   Essential hypertension 06/21/2020   Hypertension    Obesity (BMI 30-39.9) 07/08/2020   SVT (supraventricular tachycardia) (HCC) 05/08/2018    Past Surgical History:  Procedure Laterality Date   KNEE CARTILAGE SURGERY     LEFT HEART CATH AND CORONARY ANGIOGRAPHY N/A 09/02/2021   Procedure: LEFT HEART CATH AND CORONARY ANGIOGRAPHY;  Surgeon: Orbie Pyo, MD;  Location: [facility];  Service: Cardiovascular;  Laterality: N/A;    Current Medications: Current Meds  Medication Sig   acetaminophen (TYLENOL) 500 MG tablet Take 1,000 mg by mouth every 6 (six) hours as needed for mild pain.   aspirin EC 81 MG tablet Take 81 mg by mouth daily. Swallow whole.   Coenzyme Q10 (COQ10) 100 MG CAPS Take 100 mg by mouth daily.   flecainide (TAMBOCOR) 50 MG tablet Take 50 mg by mouth. Take 1 tablet ( 50 mg) am and take 1 1/2 tablets (75 mg) pm   MAGNESIUM PO Take 1 packet by mouth daily.   metoprolol succinate (TOPROL XL) 25 MG 24 hr tablet Take 0.5 tablets (12.5 mg total) by mouth 2 (two) times daily.   Taurine POWD Take 1 Scoop by mouth daily.     Allergies:   Patient has no known allergies.   Social History   Socioeconomic History   Marital status: Married    Spouse name: Not on file   Number of children: Not on file   Years of education: Not  on file   Highest education level: Not on file  Occupational History   Not on file  Tobacco Use   Smoking status: Never   Smokeless tobacco: Never  Vaping Use   Vaping Use: Never used  Substance and Sexual Activity   Alcohol use: No   Drug use: Never   Sexual activity: Not on file  Other Topics Concern   Not on file  Social History Narrative   Not on file   Social Determinants of Health   Financial Resource Strain: Not on file  Food Insecurity: Not on file  Transportation Needs: Not on file  Physical Activity: Not on file  Stress: Not on file  Social Connections: Not on file     Family History: The  patient's family history includes Arthritis in his father; Cervical cancer in his mother. ROS:   Please see the history of present illness.    All other systems reviewed and are negative.  EKGs/Labs/Other Studies Reviewed:    The following studies were reviewed today:  EKG:  EKG 10/04/2021 shows sinus rhythm first-degree AV block otherwise normal  Recent Labs: 06/29/2021: TSH 1.654 08/19/2021: Magnesium 2.1 08/29/2021: BUN 13; Creatinine, Ser 1.01; Hemoglobin 19.1; Platelets 193; Potassium 4.4; Sodium 139  Recent Lipid Panel    Component Value Date/Time   CHOL 180 08/20/2020 1155   TRIG 138 08/20/2020 1155   HDL 30 (L) 08/20/2020 1155   CHOLHDL 6.0 (H) 08/20/2020 1155   LDLCALC 125 (H) 08/20/2020 1155    Physical Exam:    VS:  BP (!) 144/79   Pulse 78   Ht 6\' 3"  (1.905 m)   Wt 260 lb (117.9 kg)   SpO2 97%   BMI 32.50 kg/m     Wt Readings from Last 3 Encounters:  10/07/21 260 lb (117.9 kg)  10/04/21 260 lb (117.9 kg)  09/02/21 250 lb (113.4 kg)     GEN:  Well nourished, well developed in no acute distress HEENT: Normal NECK: No JVD; No carotid bruits LYMPHATICS: No lymphadenopathy CARDIAC: RRR, no murmurs, rubs, gallops RESPIRATORY:  Clear to auscultation without rales, wheezing or rhonchi  ABDOMEN: Soft, non-tender, non-distended MUSCULOSKELETAL:  No edema; No deformity  SKIN: Warm and dry NEUROLOGIC:  Alert and oriented x 3 PSYCHIATRIC:  Normal affect    Signed, 09/04/21, MD  10/07/2021 4:11 PM    [facility]

## 2021-10-07 NOTE — Patient Instructions (Signed)
Medication Instructions:  Your physician recommends that you continue on your current medications as directed. Please refer to the Current Medication list given to you today.  *If you need a refill on your cardiac medications before your next appointment, please call your pharmacy*   Lab Work: Your physician recommends that you return for lab work in: TODAY Flecainide level If you have labs (blood work) drawn today and your tests are completely normal, you will receive your results only by: MyChart Message (if you have MyChart) OR A paper copy in the mail If you have any lab test that is abnormal or we need to change your treatment, we will call you to review the results.   Testing/Procedures: None   Follow-Up: At [facility], you and your health needs are our priority.  As part of our continuing mission to provide you with exceptional heart care, we have created designated Provider Care Teams.  These Care Teams include your primary Cardiologist (physician) and Advanced Practice Providers (APPs -  Physician Assistants and Nurse Practitioners) who all work together to provide you with the care you need, when you need it.  We recommend signing up for the patient portal called "MyChart".  Sign up information is provided on this After Visit Summary.  MyChart is used to connect with patients for Virtual Visits (Telemedicine).  Patients are able to view lab/test results, encounter notes, upcoming appointments, etc.  Non-urgent messages can be sent to your provider as well.   To learn more about what you can do with MyChart, go to [email or web address].    Your next appointment:   6 month(s)  The format for your next appointment:   In Person  Provider:   Brian Munley, MD   Other Instructions   

## 2021-10-19 LAB — FLECAINIDE LEVEL: Flecainide: 0.15 ug/mL — ABNORMAL LOW (ref 0.20–1.00)

## 2021-10-20 ENCOUNTER — Telehealth: Payer: Self-pay

## 2021-10-20 NOTE — Telephone Encounter (Signed)
-----   Message from Baldo Daub, MD sent at 10/20/2021  9:03 AM EST ----- Regarding: FW: Level is low but he is doing better and no changes ----- Message ----- From: Interface, [facility] Lab Results In Sent: 10/19/2021   4:37 PM EST To: Baldo Daub, MD

## 2021-10-20 NOTE — Telephone Encounter (Signed)
Spoke with patient regarding results and recommendation.  Patient verbalizes understanding and is agreeable to plan of care. Advised patient to call back with any issues or concerns.  

## 2022-03-16 DIAGNOSIS — E782 Mixed hyperlipidemia: Secondary | ICD-10-CM

## 2022-03-16 DIAGNOSIS — E291 Testicular hypofunction: Secondary | ICD-10-CM

## 2022-03-16 HISTORY — DX: Testicular hypofunction: E29.1

## 2022-03-16 HISTORY — DX: Mixed hyperlipidemia: E78.2

## 2022-04-07 ENCOUNTER — Ambulatory Visit: Payer: BC Managed Care – PPO | Admitting: Cardiology

## 2022-04-07 ENCOUNTER — Encounter: Payer: Self-pay | Admitting: Cardiology

## 2022-04-07 VITALS — BP 122/88 | HR 74 | Ht 75.0 in | Wt 262.4 lb

## 2022-04-07 DIAGNOSIS — I493 Ventricular premature depolarization: Secondary | ICD-10-CM | POA: Diagnosis not present

## 2022-04-07 DIAGNOSIS — I10 Essential (primary) hypertension: Secondary | ICD-10-CM | POA: Diagnosis not present

## 2022-04-07 DIAGNOSIS — Z79899 Other long term (current) drug therapy: Secondary | ICD-10-CM | POA: Diagnosis not present

## 2022-04-07 MED ORDER — FLECAINIDE ACETATE 50 MG PO TABS: 50.0000 mg | ORAL_TABLET | Freq: Every day | ORAL | 3 refills | Status: DC

## 2022-04-07 MED ORDER — METOPROLOL SUCCINATE ER 25 MG PO TB24
12.5000 mg | ORAL_TABLET | Freq: Two times a day (BID) | ORAL | 3 refills | Status: DC
Start: 2022-04-07 — End: 2023-03-07

## 2022-04-07 NOTE — Progress Notes (Signed)
Cardiology Office Note:    Date:  04/07/2022   ID:  Christopher Boone, DOB [birth year], MRN [id number]  PCP:  Gordan Payment., MD  Cardiologist:  Norman Herrlich, MD    Referring MD: Gordan Payment., MD    ASSESSMENT:    1. PVC's (premature ventricular contractions)   2. Essential hypertension   3. High risk medication use    PLAN:    In order of problems listed above:  He continues to do well combination beta-blocker and flecainide continue the same check flecainide level I told him we could consider withdrawal of antiarrhythmic drug in the future.   Next appointment: 9 months   Medication Adjustments/Labs and Tests Ordered: Current medicines are reviewed at length with the patient today.  Concerns regarding medicines are outlined above.  No orders of the defined types were placed in this encounter.  No orders of the defined types were placed in this encounter.   Chief Complaint  Patient presents with   Follow-up    Symptomatic PVCs    History of Present Illness:    Christopher Boone is a 61 y.o. male with a hx of symptomatic PVCs with a 20% burden SVT and hypertension last seen 10/07/2021 following left heart catheterization that showed minimal coronary atherosclerosis. Compliance with diet, lifestyle and medications: Yes    He has little or no arrhythmia not severe or sustained tolerates his flecainide and is pleased with the quality of his life. He inquires if he should stop his antiarrhythmic drug and I told him not at this time No angina edema shortness of breath or syncope   Recent labs 03/16/2022 potassium 4.3 GFR greater than 90 cc cholesterol 158 LDL 105 triglycerides 88 CBC with a hemoglobin of 17.3 previously as high as 18.2 hematocrit 49 previously 53.4 Past Medical History:  Diagnosis Date   Essential hypertension 06/21/2020   Hypertension    Obesity (BMI 30-39.9) 07/08/2020   SVT (supraventricular tachycardia) (HCC) 05/08/2018    Past Surgical History:   Procedure Laterality Date   KNEE CARTILAGE SURGERY     LEFT HEART CATH AND CORONARY ANGIOGRAPHY N/A 09/02/2021   Procedure: LEFT HEART CATH AND CORONARY ANGIOGRAPHY;  Surgeon: Orbie Pyo, MD;  Location: [facility];  Service: Cardiovascular;  Laterality: N/A;    Current Medications: Current Meds  Medication Sig   acetaminophen (TYLENOL) 500 MG tablet Take 1,000 mg by mouth every 6 (six) hours as needed for mild pain.   aspirin EC 81 MG tablet Take 81 mg by mouth daily. Swallow whole.   Coenzyme Q10 (COQ10) 100 MG CAPS Take 100 mg by mouth daily.   flecainide (TAMBOCOR) 50 MG tablet Take 50 mg by mouth. Take 1 tablet ( 50 mg) am and take 1 1/2 tablets (75 mg) pm   MAGNESIUM PO Take 1 packet by mouth daily.   metoprolol succinate (TOPROL XL) 25 MG 24 hr tablet Take 0.5 tablets (12.5 mg total) by mouth 2 (two) times daily.     Allergies:   Patient has no known allergies.   Social History   Socioeconomic History   Marital status: Married    Spouse name: Not on file   Number of children: Not on file   Years of education: Not on file   Highest education level: Not on file  Occupational History   Not on file  Tobacco Use   Smoking status: Never   Smokeless tobacco: Never  Vaping Use   Vaping Use: Never  used  Substance and Sexual Activity   Alcohol use: No   Drug use: Never   Sexual activity: Not on file  Other Topics Concern   Not on file  Social History Narrative   Not on file   Social Determinants of Health   Financial Resource Strain: Not on file  Food Insecurity: Not on file  Transportation Needs: Not on file  Physical Activity: Not on file  Stress: Not on file  Social Connections: Not on file     Family History: The patient's family history includes Arthritis in his father; Cervical cancer in his mother. ROS:   Please see the history of present illness.    All other systems reviewed and are negative.  EKGs/Labs/Other Studies Reviewed:    The  following studies were reviewed today:  EKG:  EKG ordered today and personally reviewed.  The ekg ordered today demonstrates sinus rhythm normal EKG no findings of 1C antiarrhythmic drug toxicity and no PVCs  Recent Labs: 06/29/2021: TSH 1.654 08/19/2021: Magnesium 2.1 08/29/2021: BUN 13; Creatinine, Ser 1.01; Hemoglobin 19.1; Platelets 193; Potassium 4.4; Sodium 139  Recent Lipid Panel    Component Value Date/Time   CHOL 180 08/20/2020 1155   TRIG 138 08/20/2020 1155   HDL 30 (L) 08/20/2020 1155   CHOLHDL 6.0 (H) 08/20/2020 1155   LDLCALC 125 (H) 08/20/2020 1155    Physical Exam:    VS:  BP 122/88 (BP Location: Right Arm)   Pulse 74   Ht 6\' 3"  (1.905 m)   Wt 262 lb 6.4 oz (119 kg)   SpO2 97%   BMI 32.80 kg/m     Wt Readings from Last 3 Encounters:  04/07/22 262 lb 6.4 oz (119 kg)  10/07/21 260 lb (117.9 kg)  10/04/21 260 lb (117.9 kg)     GEN:  Well nourished, well developed in no acute distress HEENT: Normal NECK: No JVD; No carotid bruits LYMPHATICS: No lymphadenopathy CARDIAC: RRR, no murmurs, rubs, gallops RESPIRATORY:  Clear to auscultation without rales, wheezing or rhonchi  ABDOMEN: Soft, non-tender, non-distended MUSCULOSKELETAL:  No edema; No deformity  SKIN: Warm and dry NEUROLOGIC:  Alert and oriented x 3 PSYCHIATRIC:  Normal affect    Signed, 10/06/21, MD  04/07/2022 8:36 AM    [facility]

## 2022-04-07 NOTE — Patient Instructions (Signed)
Medication Instructions:  Your physician recommends that you continue on your current medications as directed. Please refer to the Current Medication list given to you today.  *If you need a refill on your cardiac medications before your next appointment, please call your pharmacy*   Lab Work: Your physician recommends that you return for lab work in:   Labs today: Flecanide level  If you have labs (blood work) drawn today and your tests are completely normal, you will receive your results only by: MyChart Message (if you have MyChart) OR A paper copy in the mail If you have any lab test that is abnormal or we need to change your treatment, we will call you to review the results.   Testing/Procedures: None   Follow-Up: At [facility], you and your health needs are our priority.  As part of our continuing mission to provide you with exceptional heart care, we have created designated Provider Care Teams.  These Care Teams include your primary Cardiologist (physician) and Advanced Practice Providers (APPs -  Physician Assistants and Nurse Practitioners) who all work together to provide you with the care you need, when you need it.  We recommend signing up for the patient portal called "MyChart".  Sign up information is provided on this After Visit Summary.  MyChart is used to connect with patients for Virtual Visits (Telemedicine).  Patients are able to view lab/test results, encounter notes, upcoming appointments, etc.  Non-urgent messages can be sent to your provider as well.   To learn more about what you can do with MyChart, go to [email or web address].    Your next appointment:   9 month(s)  The format for your next appointment:   In Person  Provider:   Norman Herrlich, MD    Other Instructions None  Important Information About Sugar          1. Avoid all over-the-counter antihistamines except Claritin/Loratadine and Zyrtec/Cetrizine. 2. Avoid all combination  including cold sinus allergies flu decongestant and sleep medications 3. You can use Robitussin DM Mucinex and Mucinex DM for cough. 4. can use Tylenol aspirin ibuprofen and naproxen but no combinations such as sleep or sinus.

## 2022-04-07 NOTE — Addendum Note (Signed)
Addended by: Roxanne Mins I on: 04/07/2022 08:55 AM   Modules accepted: Orders

## 2022-04-14 LAB — FLECAINIDE LEVEL: Flecainide: 0.16 ug/ml — ABNORMAL LOW (ref 0.20–1.00)

## 2022-06-12 ENCOUNTER — Other Ambulatory Visit: Payer: Self-pay | Admitting: Cardiology

## 2022-06-22 ENCOUNTER — Other Ambulatory Visit: Payer: Self-pay | Admitting: Cardiology

## 2022-08-18 IMAGING — DX DG CHEST 1V PORT
2 series · 2 of 2 positions shown · non-contrast
Comparison: [DATE]/[id number]

CLINICAL DATA: Chest pain

EXAM:
PORTABLE CHEST 1 VIEW

[chest ap (1 of 2)]
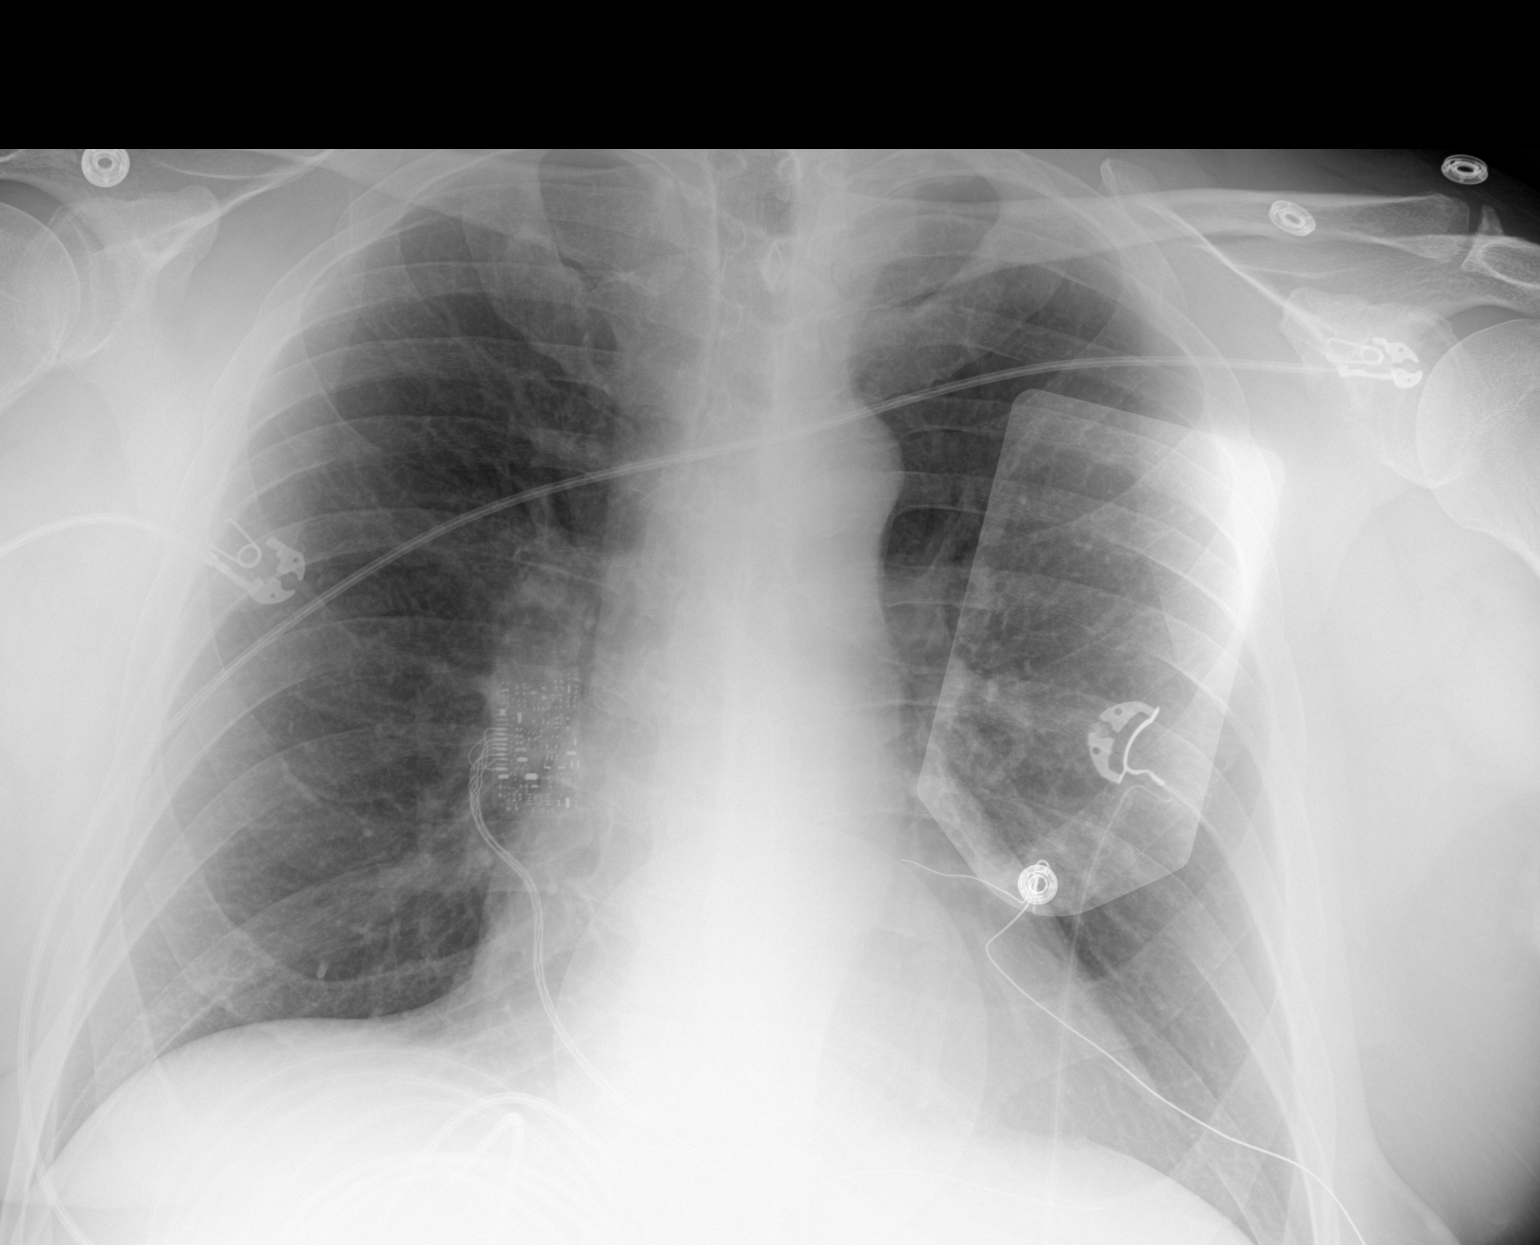

[chest ap (2 of 2)]
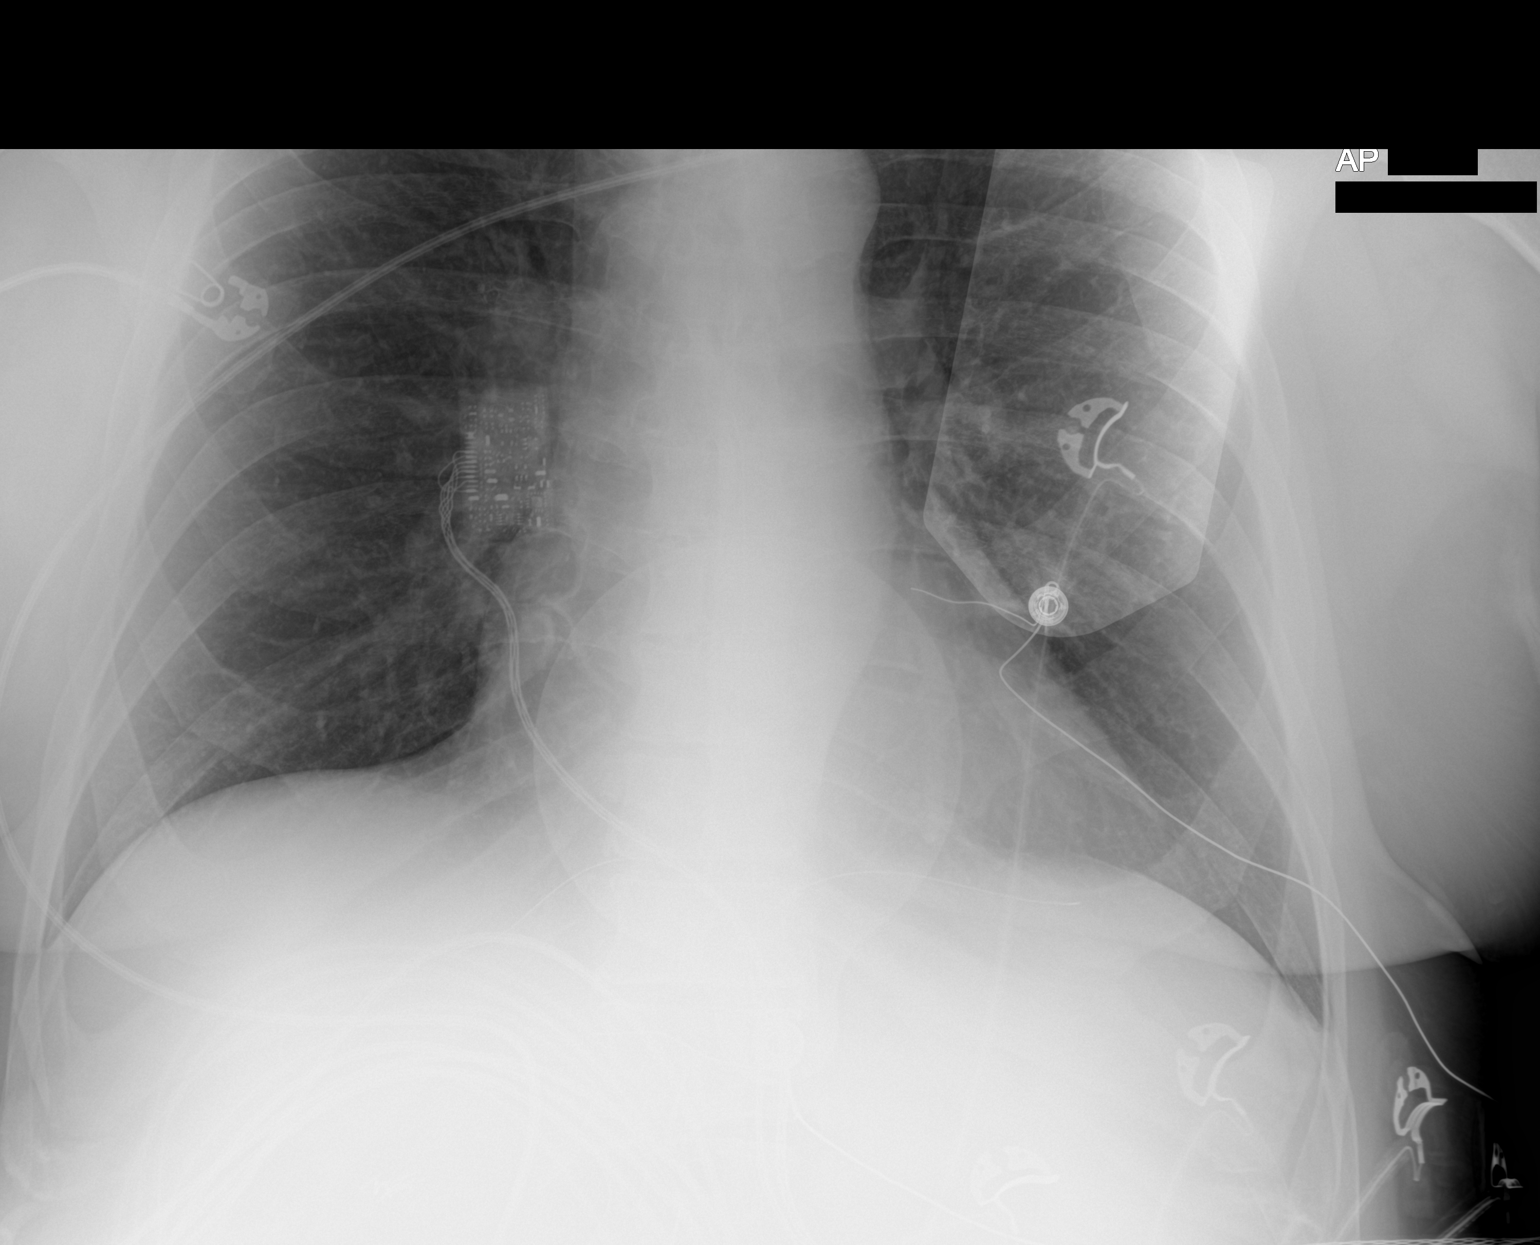

[2 of 2 positions shown; findings below may reference images not displayed]

FINDINGS: The heart size and mediastinal contours are within normal limits.
Both lungs are clear. The visualized skeletal structures are
unremarkable.
IMPRESSION: No active disease.

## 2023-01-04 ENCOUNTER — Encounter: Payer: Self-pay | Admitting: Cardiology

## 2023-01-04 ENCOUNTER — Ambulatory Visit: Payer: BC Managed Care – PPO | Attending: Cardiology | Admitting: Cardiology

## 2023-01-04 VITALS — BP 126/76 | HR 78 | Ht 75.0 in | Wt 273.0 lb

## 2023-01-04 DIAGNOSIS — I493 Ventricular premature depolarization: Secondary | ICD-10-CM | POA: Diagnosis not present

## 2023-01-04 DIAGNOSIS — Z79899 Other long term (current) drug therapy: Secondary | ICD-10-CM | POA: Diagnosis not present

## 2023-01-04 DIAGNOSIS — I471 Supraventricular tachycardia, unspecified: Secondary | ICD-10-CM | POA: Diagnosis not present

## 2023-01-04 DIAGNOSIS — I10 Essential (primary) hypertension: Secondary | ICD-10-CM | POA: Diagnosis not present

## 2023-01-04 NOTE — Patient Instructions (Addendum)
Medication Instructions:  Your physician has recommended you make the following change in your medication:   STOP: Aspirin  *If you need a refill on your cardiac medications before your next appointment, please call your pharmacy*   Lab Work: None If you have labs (blood work) drawn today and your tests are completely normal, you will receive your results only by: [place] (if you have MyChart) OR A paper copy in the mail If you have any lab test that is abnormal or we need to change your treatment, we will call you to review the results.   Testing/Procedures: None   Follow-Up: At [facility], you and your health needs are our priority.  As part of our continuing mission to provide you with exceptional heart care, we have created designated Provider Care Teams.  These Care Teams include your primary Cardiologist (physician) and Advanced Practice Providers (APPs -  Physician Assistants and Nurse Practitioners) who all work together to provide you with the care you need, when you need it.  We recommend signing up for the patient portal called "MyChart".  Sign up information is provided on this After Visit Summary.  MyChart is used to connect with patients for Virtual Visits (Telemedicine).  Patients are able to view lab/test results, encounter notes, upcoming appointments, etc.  Non-urgent messages can be sent to your provider as well.   To learn more about what you can do with MyChart, go to [email or web address].    Your next appointment:   6 month(s)  Provider:   Shirlee More, MD    Other Instructions None  This visit was accompanied by Edwyna Shell

## 2023-01-04 NOTE — Progress Notes (Signed)
Cardiology Office Note:    Date:  01/04/2023   ID:  Christopher Boone, DOB 01/22/1961, MRN [id number]  PCP:  Raina Mina., MD  Cardiologist:  Shirlee More, MD    Referring MD: Raina Mina., MD    ASSESSMENT:    1. PVC's (premature ventricular contractions)   2. High risk medication use   3. SVT (supraventricular tachycardia)   4. Essential hypertension    PLAN:    In order of problems listed above:  He continues to do well on low-dose flecainide good symptom better of his supraventricular and Ventricular arrhythmia continue same along with his beta-blocker I think in the absence of vascular disease taking aspirin is optional Well-controlled on beta-blocker  Next appointment: 6 months   Medication Adjustments/Labs and Tests Ordered: Current medicines are reviewed at length with the patient today.  Concerns regarding medicines are outlined above.  No orders of the defined types were placed in this encounter.  No orders of the defined types were placed in this encounter.   Chief complaint follow-up SVT and PVCs on flecainide   History of Present Illness:    Christopher Boone is a 62 y.o. male with a hx of symptomatic PVCs with a 20% burden SVT and hypertension last seen 10/07/2021 following left heart catheterization that showed minimal coronary atherosclerosis last seen 04/07/2022 Compliance with diet, lifestyle and medications: Yes  He decided to stop taking aspirin He has occasional palpitation not severe or sustained much improved on flecainide it tends to cluster as a smart watch and has not had any irregular or high or low rate alerts. No chest pain edema shortness of breath or syncope Past Medical History:  Diagnosis Date   Essential hypertension 06/21/2020   Hypertension    Obesity (BMI 30-39.9) 07/08/2020   SVT (supraventricular tachycardia) 05/08/2018    Past Surgical History:  Procedure Laterality Date   KNEE CARTILAGE SURGERY     LEFT HEART CATH AND  CORONARY ANGIOGRAPHY N/A 09/02/2021   Procedure: LEFT HEART CATH AND CORONARY ANGIOGRAPHY;  Surgeon: Early Osmond, MD;  Location: [facility];  Service: Cardiovascular;  Laterality: N/A;    Current Medications: Current Meds  Medication Sig   acetaminophen (TYLENOL) 500 MG tablet Take 1,000 mg by mouth every 6 (six) hours as needed for mild pain.   aspirin EC 81 MG tablet Take 81 mg by mouth daily as needed for mild pain. Swallow whole.   Coenzyme Q10 (COQ10) 100 MG CAPS Take 100 mg by mouth daily.   flecainide (TAMBOCOR) 50 MG tablet Take 1 tablet (50 mg total) by mouth daily. Take 1 tablet ( 50 mg) am and take 1 1/2 tablets (75 mg) pm   MAGNESIUM PO Take 1 packet by mouth daily.   metoprolol succinate (TOPROL XL) 25 MG 24 hr tablet Take 0.5 tablets (12.5 mg total) by mouth 2 (two) times daily.     Allergies:   Patient has no known allergies.   Social History   Socioeconomic History   Marital status: Married    Spouse name: Not on file   Number of children: Not on file   Years of education: Not on file   Highest education level: Not on file  Occupational History   Not on file  Tobacco Use   Smoking status: Never   Smokeless tobacco: Never  Vaping Use   Vaping Use: Never used  Substance and Sexual Activity   Alcohol use: No   Drug use: Never  Sexual activity: Not on file  Other Topics Concern   Not on file  Social History Narrative   Not on file   Social Determinants of Health   Financial Resource Strain: Not on file  Food Insecurity: Not on file  Transportation Needs: Not on file  Physical Activity: Not on file  Stress: Not on file  Social Connections: Not on file     Family History: The patient's family history includes Arthritis in his father; Cervical cancer in his mother. ROS:   Please see the history of present illness.    All other systems reviewed and are negative.  EKGs/Labs/Other Studies Reviewed:    The following studies were reviewed  today:  EKG:  EKG ordered today and personally reviewed.  The ekg ordered today demonstrates sinus rhythm no finding of 1C antiarrhythmic drug toxicity he has single PVCs present  Recent Labs: 08/29/2021 creatinine 1.0 potassium 4.4 cholesterol 180 LDL 125   Physical Exam:    VS:  BP 126/76 (BP Location: Right Arm, Patient Position: Sitting)   Pulse 78   Ht 6' 3"$  (1.905 m)   Wt 273 lb (123.8 kg)   SpO2 97%   BMI 34.12 kg/m     Wt Readings from Last 3 Encounters:  01/04/23 273 lb (123.8 kg)  04/07/22 262 lb 6.4 oz (119 kg)  10/07/21 260 lb (117.9 kg)     GEN:  Well nourished, well developed in no acute distress HEENT: Normal NECK: No JVD; No carotid bruits LYMPHATICS: No lymphadenopathy CARDIAC: RRR, no murmurs, rubs, gallops RESPIRATORY:  Clear to auscultation without rales, wheezing or rhonchi  ABDOMEN: Soft, non-tender, non-distended MUSCULOSKELETAL:  No edema; No deformity  SKIN: Warm and dry NEUROLOGIC:  Alert and oriented x 3 PSYCHIATRIC:  Normal affect   Seen with Edwyna Shell, RN as chaperone   Signed, Shirlee More, MD  01/04/2023 8:13 AM    [place]

## 2023-02-05 ENCOUNTER — Ambulatory Visit: Payer: BC Managed Care – PPO | Admitting: Cardiology

## 2023-02-19 ENCOUNTER — Ambulatory Visit: Payer: BC Managed Care – PPO | Admitting: Cardiology

## 2023-03-07 ENCOUNTER — Other Ambulatory Visit: Payer: Self-pay | Admitting: Cardiology

## 2023-03-07 NOTE — Telephone Encounter (Signed)
Refill to pharmacy 

## 2023-03-22 ENCOUNTER — Other Ambulatory Visit: Payer: Self-pay | Admitting: Cardiology

## 2023-04-26 DIAGNOSIS — E66812 Obesity, class 2: Secondary | ICD-10-CM | POA: Insufficient documentation

## 2023-04-26 DIAGNOSIS — E6609 Other obesity due to excess calories: Secondary | ICD-10-CM

## 2023-04-26 HISTORY — DX: Other obesity due to excess calories: E66.09

## 2023-11-08 DIAGNOSIS — B002 Herpesviral gingivostomatitis and pharyngotonsillitis: Secondary | ICD-10-CM

## 2023-11-08 HISTORY — DX: Herpesviral gingivostomatitis and pharyngotonsillitis: B00.2

## 2023-11-20 ENCOUNTER — Other Ambulatory Visit: Payer: Self-pay

## 2023-11-20 ENCOUNTER — Telehealth: Payer: Self-pay | Admitting: Cardiology

## 2023-11-20 MED ORDER — TOPROL XL 25 MG PO TB24: 25.0000 mg | ORAL_TABLET | Freq: Every day | ORAL | 0 refills | Status: DC

## 2023-11-20 NOTE — Telephone Encounter (Signed)
*  STAT* If patient is at the pharmacy, call can be transferred to refill team.   1. Which medications need to be refilled? (please list name of each medication and dose if known) TOPROL  XL 25 MG 24 hr tablet   2. Which pharmacy/location (including street and city if local pharmacy) is medication to be sent to? [id number] - [place], [place] - [email or web address] Phone: [phone]  Fax: [phone]     3. Do they need a 30 day or 90 day supply? 90

## 2023-12-25 ENCOUNTER — Encounter: Payer: Self-pay | Admitting: Cardiology

## 2023-12-26 NOTE — Progress Notes (Signed)
 Cardiology Office Note:    Date:  12/27/2023   ID:  Christopher Boone, DOB [birth year], MRN [id number]  PCP:  Thurmond Cathlyn LABOR., MD  Cardiologist:  Redell Leiter, MD    Referring MD: Thurmond Cathlyn LABOR., MD    ASSESSMENT:    1. PVC's (premature ventricular contractions)   2. SVT (supraventricular tachycardia) (HCC)   3. High risk medication use   4. Essential hypertension   5. Mixed hyperlipidemia    PLAN:    In order of problems listed above:  Arrhythmias well-controlled continue low-dose flecainide  beta-blocker Controlled hypertension continue his beta-blocker He has mild coronary atherosclerosis and is on a statin with a good result lipids continue the rosuvastatin 10 mg daily   Next appointment: 9 months   Medication Adjustments/Labs and Tests Ordered: Current medicines are reviewed at length with the patient today.  Concerns regarding medicines are outlined above.  Orders Placed This Encounter  Procedures   EKG 12-Lead   Meds ordered this encounter  Medications   metoprolol  succinate (TOPROL  XL) 25 MG 24 hr tablet    Sig: Take 1 tablet (25 mg total) by mouth in the morning and at bedtime.    Dispense:  180 tablet    Refill:  3     History of Present Illness:    Christopher Boone is a 63 y.o. male with a hx of SVT and symptomatic PVCs well suppressed with low-dose flecainide  and hypertension last seen 01/04/2023.  Previous coronary angiography showed minimal coronary atherosclerosis.  Compliance with diet, lifestyle and medications: Yes  He continues to do well with flecainide  low higher dose of Toprol  he is really done a nice job controlling his symptomatic PVCs No edema without chest painor syncope His EKG shows no finding of 1C antiarrhythmic drug toxicity  Past Medical History:  Diagnosis Date   Class 2 obesity due to excess calories without serious comorbidity with body mass index (BMI) of 36.0 to 36.9 in adult 04/26/2023   Essential hypertension 06/21/2020    Hypertension    Hypogonadism in male 03/16/2022   I do not recommend treatment secondary to significant polycythemia.     Mixed hyperlipidemia 03/16/2022   Need for influenza vaccination 08/22/2018   Obesity (BMI 30-39.9) 07/08/2020   Oral herpes 11/08/2023   Otitis media without spontaneous rupture of tympanic membrane 08/22/2018   Polycythemia 08/31/2021   Likely from testosterone usage.     PVC (premature ventricular contraction)    Recurrent chest pain 08/31/2021   SVT (supraventricular tachycardia) (HCC) 05/08/2018    Current Medications: Current Meds  Medication Sig   acetaminophen  (TYLENOL ) 500 MG tablet Take 1,000 mg by mouth every 6 (six) hours as needed for mild pain.   Coenzyme Q10 (COQ10) 100 MG CAPS Take 100 mg by mouth daily.   flecainide  (TAMBOCOR ) 50 MG tablet Take 1 tablet (50 mg total) by mouth every morning AND 1.5 tablets (75 mg total) every evening.   MAGNESIUM PO Take 1 packet by mouth daily.   metoprolol  succinate (TOPROL  XL) 25 MG 24 hr tablet Take 1 tablet (25 mg total) by mouth in the morning and at bedtime.   rosuvastatin (CRESTOR) 10 MG tablet Take 1 tablet by mouth daily.   valACYclovir  (VALTREX ) 1000 MG tablet Take 2,000 mg by mouth 2 (two) times daily.   [DISCONTINUED] TOPROL  XL 25 MG 24 hr tablet Take 1 tablet (25 mg total) by mouth daily.      EKGs/Labs/Other Studies Reviewed:    The following  studies were reviewed today:  Cardiac Studies & Procedures   CARDIAC CATHETERIZATION  CARDIAC CATHETERIZATION 09/02/2021  Narrative   Prox LAD lesion is 10% stenosed.   LV end diastolic pressure is normal.  1.  Very mild obstructive coronary artery disease 2.  Normal LVEDP  Findings Coronary Findings Diagnostic  Dominance: Right  Left Anterior Descending Prox LAD lesion is 10% stenosed.  Intervention  No interventions have been documented.    ECHOCARDIOGRAM  ECHOCARDIOGRAM COMPLETE 08/11/2021  Narrative ECHOCARDIOGRAM  REPORT    Patient Name:   Christopher Boone Date of Exam: 08/11/2021 Medical Rec #:  [id number]            Height:       75.0 in Accession #:    [id number]           Weight:       262.4 lb Date of Birth:  13-Oct-1961            BSA:          2.463 m Patient Age:    63 years             BP:           144/80 mmHg Patient Gender: M                    HR:           81 bpm. Exam Location:  [facility]  Procedure: 2D Echo  Indications:    Chest pain of uncertain etiology [R07.9 (ICD-10-CM)]; Frequent PVCs [I49.3 (ICD-10-CM)]  History:        Patient has no prior history of Echocardiogram examinations. Risk Factors:Hypertension.  Sonographer:    Christopher Silvas RDCS Referring Phys: [id number] CALLIE E GOODRICH  IMPRESSIONS   1. Left ventricular ejection fraction, by estimation, is 55 to 60%. The left ventricle has normal function. The left ventricle has no regional wall motion abnormalities. There is mild left ventricular hypertrophy. Left ventricular diastolic parameters were normal. 2. Right ventricular systolic function is normal. The right ventricular size is normal. 3. The mitral valve is normal in structure. No evidence of mitral valve regurgitation. No evidence of mitral stenosis. 4. The aortic valve is normal in structure. Aortic valve regurgitation is not visualized. No aortic stenosis is present. 5. The inferior vena cava is normal in size with greater than 50% respiratory variability, suggesting right atrial pressure of 3 mmHg.  FINDINGS Left Ventricle: Left ventricular ejection fraction, by estimation, is 55 to 60%. The left ventricle has normal function. The left ventricle has no regional wall motion abnormalities. The left ventricular internal cavity size was normal in size. There is mild left ventricular hypertrophy. Left ventricular diastolic parameters were normal.  Right Ventricle: The right ventricular size is normal. No increase in right ventricular wall thickness. Right  ventricular systolic function is normal.  Left Atrium: Left atrial size was normal in size.  Right Atrium: Right atrial size was normal in size.  Pericardium: There is no evidence of pericardial effusion.  Mitral Valve: The mitral valve is normal in structure. No evidence of mitral valve regurgitation. No evidence of mitral valve stenosis.  Tricuspid Valve: The tricuspid valve is normal in structure. Tricuspid valve regurgitation is trivial. No evidence of tricuspid stenosis.  Aortic Valve: The aortic valve is normal in structure. Aortic valve regurgitation is not visualized. No aortic stenosis is present.  Pulmonic Valve: The pulmonic valve was normal in structure. Pulmonic valve regurgitation is not visualized.  No evidence of pulmonic stenosis.  Aorta: The aortic root is normal in size and structure.  Venous: The inferior vena cava is normal in size with greater than 50% respiratory variability, suggesting right atrial pressure of 3 mmHg.  IAS/Shunts: No atrial level shunt detected by color flow Doppler.   LEFT VENTRICLE PLAX 2D LVIDd:         5.40 cm  Diastology LVIDs:         3.60 cm  LV e' medial:    5.77 cm/s LV PW:         1.20 cm  LV E/e' medial:  11.0 LV IVS:        1.20 cm  LV e' lateral:   7.72 cm/s LVOT diam:     2.30 cm  LV E/e' lateral: 8.2 LV SV:         79 LV SV Index:   32 LVOT Area:     4.15 cm   RIGHT VENTRICLE             IVC RV S prime:     12.50 cm/s  IVC diam: 1.30 cm TAPSE (M-mode): 3.0 cm  LEFT ATRIUM             Index       RIGHT ATRIUM           Index LA diam:        4.70 cm 1.91 cm/m  RA Area:     21.40 cm LA Vol (A2C):   54.9 ml 22.29 ml/m RA Volume:   64.30 ml  26.11 ml/m LA Vol (A4C):   69.8 ml 28.34 ml/m LA Biplane Vol: 64.9 ml 26.35 ml/m AORTIC VALVE LVOT Vmax:   101.00 cm/s LVOT Vmean:  74.400 cm/s LVOT VTI:    0.190 m  AORTA Ao Root diam: 3.70 cm Ao Asc diam:  3.40 cm Ao Desc diam: 2.30 cm  MITRAL VALVE                TRICUSPID VALVE MV Area (PHT): 2.62 cm    TR Peak grad:   18.3 mmHg MV Decel Time: 289 msec    TR Vmax:        214.00 cm/s MV E velocity: 63.40 cm/s MV A velocity: 51.40 cm/s  SHUNTS MV E/A ratio:  1.23        Systemic VTI:  0.19 m Systemic Diam: 2.30 cm  Lamar Fitch MD Electronically signed by Lamar Fitch MD Signature Date/Time: 08/12/2021/10:27:44 AM    Final   MONITORS  LONG TERM MONITOR (3-14 DAYS) 08/25/2021  Narrative Patch Wear Time:  3 days and 5 hours  Predominant underlying rhythm was sinus rhythm Less than 1% supraventricular ectopy 20.4% ventricular ectopy No atrial fibrillation noted Symptoms associated with ventricular ectopy  Will Camnitz, MD           EKG Interpretation Date/Time:  Thursday December 27 2023 15:59:04 EST Ventricular Rate:  78 PR Interval:  180 QRS Duration:  96 QT Interval:  400 QTC Calculation: 456 R Axis:   -4  Text Interpretation: Sinus rhythm with occasional Premature ventricular complexes When compared with ECG of 29-Jun-2021 07:53, No significant change was found Confirmed by Monetta Rogue ([id number]) on 12/27/2023 4:04:08 PM   Recent Labs: 11/08/2023 hemoglobin 15.9 platelets also normal 193 the thousand CMP normal potassium 4.5 sodium 137 creatinine 0.85 GFR greater than 90 cc normal liver function test Cholesterol 163 LDL 106 HDL 34 non-HDL cholesterol 129   Physical Exam:  VS:  BP 122/82   Pulse 78   Ht 6' 3 (1.905 m)   Wt 296 lb 3.2 oz (134.4 kg)   SpO2 96%   BMI 37.02 kg/m     Wt Readings from Last 3 Encounters:  12/27/23 296 lb 3.2 oz (134.4 kg)  01/04/23 273 lb (123.8 kg)  04/07/22 262 lb 6.4 oz (119 kg)     GEN:  Well nourished, well developed in no acute distress HEENT: Normal NECK: No JVD; No carotid bruits LYMPHATICS: No lymphadenopathy CARDIAC: RRR, no murmurs, rubs, gallops RESPIRATORY:  Clear to auscultation without rales, wheezing or rhonchi  ABDOMEN: Soft, non-tender,  non-distended MUSCULOSKELETAL:  No edema; No deformity  SKIN: Warm and dry NEUROLOGIC:  Alert and oriented x 3 PSYCHIATRIC:  Normal affect    Signed, Redell Leiter, MD  12/27/2023 5:06 PM    [facility]

## 2023-12-27 ENCOUNTER — Encounter: Payer: Self-pay | Admitting: Cardiology

## 2023-12-27 ENCOUNTER — Ambulatory Visit: Payer: 59 | Attending: Cardiology | Admitting: Cardiology

## 2023-12-27 VITALS — BP 122/82 | HR 78 | Ht 75.0 in | Wt 296.2 lb

## 2023-12-27 DIAGNOSIS — I493 Ventricular premature depolarization: Secondary | ICD-10-CM | POA: Diagnosis not present

## 2023-12-27 DIAGNOSIS — I471 Supraventricular tachycardia, unspecified: Secondary | ICD-10-CM

## 2023-12-27 DIAGNOSIS — I10 Essential (primary) hypertension: Secondary | ICD-10-CM | POA: Diagnosis not present

## 2023-12-27 DIAGNOSIS — Z79899 Other long term (current) drug therapy: Secondary | ICD-10-CM | POA: Diagnosis not present

## 2023-12-27 DIAGNOSIS — E782 Mixed hyperlipidemia: Secondary | ICD-10-CM

## 2023-12-27 MED ORDER — METOPROLOL SUCCINATE ER 25 MG PO TB24: 25.0000 mg | ORAL_TABLET | Freq: Two times a day (BID) | ORAL | 3 refills | Status: DC

## 2023-12-27 NOTE — Patient Instructions (Signed)
 Medication Instructions:  Your physician has recommended you make the following change in your medication:   START: Toprol  XL 25 mg two times daily  *If you need a refill on your cardiac medications before your next appointment, please call your pharmacy*   Lab Work: None If you have labs (blood work) drawn today and your tests are completely normal, you will receive your results only by: MyChart Message (if you have MyChart) OR A paper copy in the mail If you have any lab test that is abnormal or we need to change your treatment, we will call you to review the results.   Testing/Procedures: None   Follow-Up: At [facility], you and your health needs are our priority.  As part of our continuing mission to provide you with exceptional heart care, we have created designated Provider Care Teams.  These Care Teams include your primary Cardiologist (physician) and Advanced Practice Providers (APPs -  Physician Assistants and Nurse Practitioners) who all work together to provide you with the care you need, when you need it.  We recommend signing up for the patient portal called MyChart.  Sign up information is provided on this After Visit Summary.  MyChart is used to connect with patients for Virtual Visits (Telemedicine).  Patients are able to view lab/test results, encounter notes, upcoming appointments, etc.  Non-urgent messages can be sent to your provider as well.   To learn more about what you can do with MyChart, go to [email or web address].    Your next appointment:   9 month(s)  Provider:   Redell Leiter, MD    Other Instructions None

## 2024-02-19 ENCOUNTER — Other Ambulatory Visit: Payer: Self-pay | Admitting: Cardiology

## 2024-02-19 NOTE — Telephone Encounter (Signed)
 Prescription sent to pharmacy. Changed from 25 mg once a day to 25 mg BID per Dr. Hulen Shouts note on 12/27/2023.

## 2024-05-17 ENCOUNTER — Other Ambulatory Visit: Payer: Self-pay | Admitting: Cardiology

## 2024-06-18 ENCOUNTER — Telehealth: Payer: Self-pay | Admitting: Cardiology

## 2024-06-18 NOTE — Telephone Encounter (Signed)
 Pts wife states the pt was having PVC's at 3am but he's at work so she can't say how the pt is feeling now.

## 2024-06-18 NOTE — Telephone Encounter (Signed)
 Left message for the patient to call back.

## 2024-06-19 ENCOUNTER — Telehealth: Payer: Self-pay

## 2024-06-19 ENCOUNTER — Other Ambulatory Visit: Payer: Self-pay

## 2024-06-19 DIAGNOSIS — I493 Ventricular premature depolarization: Secondary | ICD-10-CM

## 2024-06-19 NOTE — Telephone Encounter (Signed)
 Called the patient and he reported that he has been having 11 - 20 PVC's about every few minutes. They happen so frequently that it wakes him out of his sleep. He states that he currently has no other symptoms. His previous EKG showed that he had 2 PVC's during the EKG. Patient also has a history of tachycardia with a rate of 120 - 130's. Spoke to Dr. Krasowski and he recommended that the patient wear a zio heart monitor for 7 days. I relayed Dr. Karry recommendation to the patient and he verbalized understanding and had no further questions at this time. A monitor visit was scheduled for him on 06/20/24 at 8:30 am to have his heart monitor put on.

## 2024-06-19 NOTE — Telephone Encounter (Signed)
 Called the patient and he reported that he has been having 11 - 20 PVC's about every few minutes. They happen so frequently that it wakes him out of his sleep. He states that he currently has no other symptoms. His previous EKG showed that he had 2 PVC's during the EKG.  Patient also has a history of tachycardia with a rate of 120 - 130's. Please advise.

## 2024-06-20 ENCOUNTER — Ambulatory Visit: Attending: Cardiology

## 2024-06-20 DIAGNOSIS — I493 Ventricular premature depolarization: Secondary | ICD-10-CM

## 2024-07-27 DIAGNOSIS — I493 Ventricular premature depolarization: Secondary | ICD-10-CM | POA: Diagnosis not present

## 2024-07-29 ENCOUNTER — Ambulatory Visit: Payer: Self-pay | Admitting: Cardiology

## 2024-07-31 ENCOUNTER — Other Ambulatory Visit: Payer: Self-pay

## 2024-07-31 DIAGNOSIS — I493 Ventricular premature depolarization: Secondary | ICD-10-CM

## 2024-08-01 ENCOUNTER — Other Ambulatory Visit: Payer: Self-pay

## 2024-08-01 DIAGNOSIS — E782 Mixed hyperlipidemia: Secondary | ICD-10-CM

## 2024-08-01 DIAGNOSIS — I10 Essential (primary) hypertension: Secondary | ICD-10-CM

## 2024-08-01 DIAGNOSIS — Z79899 Other long term (current) drug therapy: Secondary | ICD-10-CM

## 2024-08-03 ENCOUNTER — Encounter: Payer: Self-pay | Admitting: Cardiology

## 2024-08-07 NOTE — Progress Notes (Unsigned)
 Cardiology Office Note:    Date:  08/08/2024   ID:  Christopher Boone, DOB [birth year], MRN [id number]  PCP:  Thurmond Cathlyn LABOR., MD  Cardiologist:  Redell Leiter, MD    Referring MD: Thurmond Cathlyn LABOR., MD    ASSESSMENT:    1. PVC's (premature ventricular contractions)   2. High risk medication use   3. Essential hypertension   4. Mixed hyperlipidemia    PLAN:    In order of problems listed above:  Continues to have frequent symptomatic PVCs.  I am unsure whether flecainide  is beneficial in his situation.  Check a blood level if it is subtherapeutic will increase dose his EKG shows no finding of toxicity.  If the level is therapeutic I think he needs to be considered for PVC ablation.  They agree. Stable hypertension continue beta-blocker Currently not on lipid-lowering therapy   Next appointment: 3 months   Medication Adjustments/Labs and Tests Ordered: Current medicines are reviewed at length with the patient today.  Concerns regarding medicines are outlined above.  Orders Placed This Encounter  Procedures   EKG 12-Lead   No orders of the defined types were placed in this encounter.    History of Present Illness:    Christopher Boone is a 63 y.o. male with a hx of SVT and symptomatic PVCs treated with flecainide  and hypertension last seen 12/26/2022.  Recently contacted the office for increasing frequency of palpitation and an event monitor which showed the presence of frequent PVCs burden 20.8% no episodes of ventricular tachycardia.  Compliance with diet, lifestyle and medications: Yes  He is seen today along with his wife who is an Charity fundraiser He continues to be bothered by palpitation particularly in the morning and makes him feel weak and what he describes as jittery with a sensation of his heart beating Also associated with fatigue He did increase his dose of flecainide  we will check a level today along with BMP and magnesium. He is not taking any over-the-counter proarrhythmic  drugs He is concerned that he may develop cardiac injury and an echocardiogram was appropriate with his high frequency of PVCs and he has questions regarding atrial fibrillation ablation Not having chest pain edema shortness of breath or syncope Past Medical History:  Diagnosis Date   Class 2 obesity due to excess calories without serious comorbidity with body mass index (BMI) of 36.0 to 36.9 in adult 04/26/2023   Essential hypertension 06/21/2020   Hypertension    Hypogonadism in male 03/16/2022   I do not recommend treatment secondary to significant polycythemia.     Mixed hyperlipidemia 03/16/2022   Need for influenza vaccination 08/22/2018   Obesity (BMI 30-39.9) 07/08/2020   Oral herpes 11/08/2023   Otitis media without spontaneous rupture of tympanic membrane 08/22/2018   Polycythemia 08/31/2021   Likely from testosterone usage.     PVC (premature ventricular contraction)    Recurrent chest pain 08/31/2021   SVT (supraventricular tachycardia) (HCC) 05/08/2018    Current Medications: Current Meds  Medication Sig   acetaminophen  (TYLENOL ) 500 MG tablet Take 1,000 mg by mouth every 6 (six) hours as needed for mild pain.   Coenzyme Q10 (COQ10) 100 MG CAPS Take 100 mg by mouth daily.   flecainide  (TAMBOCOR ) 50 MG tablet TAKE 1 TABLET BY MOUTH EVERY MORNING AND 1 1/2 TABLETS EVERY EVENING   TOPROL  XL 25 MG 24 hr tablet Take 1 tablet (25 mg total) by mouth in the morning and at bedtime.   valACYclovir  (VALTREX )  1000 MG tablet Take 2,000 mg by mouth 2 (two) times daily.   [DISCONTINUED] MAGNESIUM PO Take 1 packet by mouth daily.      EKGs/Labs/Other Studies Reviewed:    The following studies were reviewed today:  Cardiac Studies & Procedures   ______________________________________________________________________________________________ CARDIAC CATHETERIZATION  CARDIAC CATHETERIZATION 09/02/2021  Conclusion   Prox LAD lesion is 10% stenosed.   LV end diastolic pressure is  normal.  1.  Very mild obstructive coronary artery disease 2.  Normal LVEDP  Findings Coronary Findings Diagnostic  Dominance: Right  Left Anterior Descending Prox LAD lesion is 10% stenosed.  Intervention  No interventions have been documented.     ECHOCARDIOGRAM  ECHOCARDIOGRAM COMPLETE 08/11/2021  Narrative ECHOCARDIOGRAM REPORT    Patient Name:   Christopher Boone Dible Date of Exam: 08/11/2021 Medical Rec #:  [id number]            Height:       75.0 in Accession #:    [id number]           Weight:       262.4 lb Date of Birth:  March 02, 1961            BSA:          2.463 m Patient Age:    60 years             BP:           144/80 mmHg Patient Gender: M                    HR:           81 bpm. Exam Location:  [facility]  Procedure: 2D Echo  Indications:    Chest pain of uncertain etiology [R07.9 (ICD-10-CM)]; Frequent PVCs [I49.3 (ICD-10-CM)]  History:        Patient has no prior history of Echocardiogram examinations. Risk Factors:Hypertension.  Sonographer:    Christopher Silvas RDCS Referring Phys: [id number] CALLIE E GOODRICH  IMPRESSIONS   1. Left ventricular ejection fraction, by estimation, is 55 to 60%. The left ventricle has normal function. The left ventricle has no regional wall motion abnormalities. There is mild left ventricular hypertrophy. Left ventricular diastolic parameters were normal. 2. Right ventricular systolic function is normal. The right ventricular size is normal. 3. The mitral valve is normal in structure. No evidence of mitral valve regurgitation. No evidence of mitral stenosis. 4. The aortic valve is normal in structure. Aortic valve regurgitation is not visualized. No aortic stenosis is present. 5. The inferior vena cava is normal in size with greater than 50% respiratory variability, suggesting right atrial pressure of 3 mmHg.  FINDINGS Left Ventricle: Left ventricular ejection fraction, by estimation, is 55 to 60%. The left ventricle has normal  function. The left ventricle has no regional wall motion abnormalities. The left ventricular internal cavity size was normal in size. There is mild left ventricular hypertrophy. Left ventricular diastolic parameters were normal.  Right Ventricle: The right ventricular size is normal. No increase in right ventricular wall thickness. Right ventricular systolic function is normal.  Left Atrium: Left atrial size was normal in size.  Right Atrium: Right atrial size was normal in size.  Pericardium: There is no evidence of pericardial effusion.  Mitral Valve: The mitral valve is normal in structure. No evidence of mitral valve regurgitation. No evidence of mitral valve stenosis.  Tricuspid Valve: The tricuspid valve is normal in structure. Tricuspid valve regurgitation is trivial. No evidence of tricuspid  stenosis.  Aortic Valve: The aortic valve is normal in structure. Aortic valve regurgitation is not visualized. No aortic stenosis is present.  Pulmonic Valve: The pulmonic valve was normal in structure. Pulmonic valve regurgitation is not visualized. No evidence of pulmonic stenosis.  Aorta: The aortic root is normal in size and structure.  Venous: The inferior vena cava is normal in size with greater than 50% respiratory variability, suggesting right atrial pressure of 3 mmHg.  IAS/Shunts: No atrial level shunt detected by color flow Doppler.   LEFT VENTRICLE PLAX 2D LVIDd:         5.40 cm  Diastology LVIDs:         3.60 cm  LV e' medial:    5.77 cm/s LV PW:         1.20 cm  LV E/e' medial:  11.0 LV IVS:        1.20 cm  LV e' lateral:   7.72 cm/s LVOT diam:     2.30 cm  LV E/e' lateral: 8.2 LV SV:         79 LV SV Index:   32 LVOT Area:     4.15 cm   RIGHT VENTRICLE             IVC RV S prime:     12.50 cm/s  IVC diam: 1.30 cm TAPSE (M-mode): 3.0 cm  LEFT ATRIUM             Index       RIGHT ATRIUM           Index LA diam:        4.70 cm 1.91 cm/m  RA Area:     21.40 cm LA  Vol (A2C):   54.9 ml 22.29 ml/m RA Volume:   64.30 ml  26.11 ml/m LA Vol (A4C):   69.8 ml 28.34 ml/m LA Biplane Vol: 64.9 ml 26.35 ml/m AORTIC VALVE LVOT Vmax:   101.00 cm/s LVOT Vmean:  74.400 cm/s LVOT VTI:    0.190 m  AORTA Ao Root diam: 3.70 cm Ao Asc diam:  3.40 cm Ao Desc diam: 2.30 cm  MITRAL VALVE               TRICUSPID VALVE MV Area (PHT): 2.62 cm    TR Peak grad:   18.3 mmHg MV Decel Time: 289 msec    TR Vmax:        214.00 cm/s MV E velocity: 63.40 cm/s MV A velocity: 51.40 cm/s  SHUNTS MV E/A ratio:  1.23        Systemic VTI:  0.19 m Systemic Diam: 2.30 cm  Lamar Fitch MD Electronically signed by Lamar Fitch MD Signature Date/Time: 08/12/2021/10:27:44 AM    Final    MONITORS  LONG TERM MONITOR (3-14 DAYS) 07/07/2024  Narrative Patch Wear Time:  10 days and 14 hours (2025-08-02T04:29:59-0400 to 2025-08-12T18:48:03-399)  Patient had a min HR of 51 bpm, max HR of 103 bpm, and avg HR of 76 bpm. Predominant underlying rhythm was Sinus Rhythm. Isolated SVEs were rare (<1.0%), and no SVE Couplets or SVE Triplets were present. Isolated VEs were frequent (20.8%, 152361), VE Couplets were rare (<1.0%, 208), and VE Triplets were rare (<1.0%, 1). Ventricular Bigeminy and Trigeminy were present.  Summary conclusions: Frequent ventricular ectopy noted total burden 20.8%, no sustained arrhythmias, ventricular bigeminy and trigeminy present, asymptomatic       ______________________________________________________________________________________________          Recent Labs: No results  found for requested labs within last 365 days.  Recent Lipid Panel    Component Value Date/Time   CHOL 180 08/20/2020 1155   TRIG 138 08/20/2020 1155   HDL 30 (L) 08/20/2020 1155   CHOLHDL 6.0 (H) 08/20/2020 1155   LDLCALC 125 (H) 08/20/2020 1155    Physical Exam:    VS:  BP 128/76   Pulse 71   Ht 6' 3 (1.905 m)   Wt 282 lb 9.6 oz (128.2 kg)   SpO2 97%    BMI 35.32 kg/m     Wt Readings from Last 3 Encounters:  08/08/24 282 lb 9.6 oz (128.2 kg)  12/27/23 296 lb 3.2 oz (134.4 kg)  01/04/23 273 lb (123.8 kg)     GEN:  Well nourished, well developed in no acute distress HEENT: Normal NECK: No JVD; No carotid bruits LYMPHATICS: No lymphadenopathy CARDIAC: RRR, no murmurs, rubs, gallops RESPIRATORY:  Clear to auscultation without rales, wheezing or rhonchi  ABDOMEN: Soft, non-tender, non-distended MUSCULOSKELETAL:  No edema; No deformity  SKIN: Warm and dry NEUROLOGIC:  Alert and oriented x 3 PSYCHIATRIC:  Normal affect    Signed, Redell Leiter, MD  08/08/2024 8:01 AM    [facility]

## 2024-08-08 ENCOUNTER — Encounter: Payer: Self-pay | Admitting: Cardiology

## 2024-08-08 ENCOUNTER — Ambulatory Visit: Attending: Cardiology | Admitting: Cardiology

## 2024-08-08 VITALS — BP 128/76 | HR 71 | Ht 75.0 in | Wt 282.6 lb

## 2024-08-08 DIAGNOSIS — I493 Ventricular premature depolarization: Secondary | ICD-10-CM

## 2024-08-08 DIAGNOSIS — E782 Mixed hyperlipidemia: Secondary | ICD-10-CM

## 2024-08-08 DIAGNOSIS — I10 Essential (primary) hypertension: Secondary | ICD-10-CM

## 2024-08-08 DIAGNOSIS — Z79899 Other long term (current) drug therapy: Secondary | ICD-10-CM | POA: Diagnosis not present

## 2024-08-08 MED ORDER — METOPROLOL TARTRATE 25 MG PO TABS: 12.5000 mg | ORAL_TABLET | Freq: Two times a day (BID) | ORAL | 3 refills | Status: DC

## 2024-08-08 MED ORDER — METOPROLOL SUCCINATE ER 25 MG PO TB24: 25.0000 mg | ORAL_TABLET | Freq: Two times a day (BID) | ORAL | 3 refills | Status: AC

## 2024-08-08 NOTE — Patient Instructions (Addendum)
 Medication Instructions:  Your physician has recommended you make the following change in your medication:   START: Metoprolol  succinate 25 mg two times daily  *If you need a refill on your cardiac medications before your next appointment, please call your pharmacy*  Lab Work: Your physician recommends that you return for lab work in:   Labs today: Flecainide  level, BMP, Magnesium  If you have labs (blood work) drawn today and your tests are completely normal, you will receive your results only by: MyChart Message (if you have MyChart) OR A paper copy in the mail If you have any lab test that is abnormal or we need to change your treatment, we will call you to review the results.  Testing/Procedures: None  Follow-Up: At [facility], you and your health needs are our priority.  As part of our continuing mission to provide you with exceptional heart care, our providers are all part of one team.  This team includes your primary Cardiologist (physician) and Advanced Practice Providers or APPs (Physician Assistants and Nurse Practitioners) who all work together to provide you with the care you need, when you need it.  Your next appointment:   3 month(s)  Provider:   Redell Leiter, MD    We recommend signing up for the patient portal called MyChart.  Sign up information is provided on this After Visit Summary.  MyChart is used to connect with patients for Virtual Visits (Telemedicine).  Patients are able to view lab/test results, encounter notes, upcoming appointments, etc.  Non-urgent messages can be sent to your provider as well.   To learn more about what you can do with MyChart, go to [email or web address].   Other Instructions None

## 2024-08-13 ENCOUNTER — Encounter: Payer: Self-pay | Admitting: Cardiology

## 2024-08-13 LAB — BASIC METABOLIC PANEL WITH GFR
BUN/Creatinine Ratio: 17 (ref 10–24)
BUN: 14 mg/dL (ref 8–27)
CO2: 19 mmol/L — ABNORMAL LOW (ref 20–29)
Calcium: 8.4 mg/dL — ABNORMAL LOW (ref 8.6–10.2)
Chloride: 105 mmol/L (ref 96–106)
Creatinine, Ser: 0.82 mg/dL (ref 0.76–1.27)
Glucose: 96 mg/dL (ref 70–99)
Potassium: 4.2 mmol/L (ref 3.5–5.2)
Sodium: 138 mmol/L (ref 134–144)
eGFR: 98 mL/min/1.73 (ref >59)

## 2024-08-13 LAB — FLECAINIDE LEVEL: Flecainide: 0.34 ug/mL (ref 0.20–1.00)

## 2024-08-13 LAB — MAGNESIUM: Magnesium: 2.1 mg/dL (ref 1.6–2.3)

## 2024-08-15 ENCOUNTER — Ambulatory Visit: Admitting: Cardiology

## 2024-10-02 ENCOUNTER — Ambulatory Visit: Admitting: Cardiology

## 2024-11-07 ENCOUNTER — Ambulatory Visit: Admitting: Cardiology

## 2024-11-27 ENCOUNTER — Other Ambulatory Visit: Payer: Self-pay | Admitting: Cardiology

## 2024-12-24 ENCOUNTER — Encounter: Payer: Self-pay | Admitting: Cardiology

## 2024-12-24 ENCOUNTER — Ambulatory Visit: Admitting: Cardiology

## 2024-12-24 VITALS — BP 132/82 | HR 62 | Ht 75.0 in | Wt 277.8 lb

## 2024-12-24 DIAGNOSIS — I2510 Atherosclerotic heart disease of native coronary artery without angina pectoris: Secondary | ICD-10-CM | POA: Diagnosis not present

## 2024-12-24 DIAGNOSIS — I493 Ventricular premature depolarization: Secondary | ICD-10-CM | POA: Diagnosis not present

## 2024-12-24 DIAGNOSIS — Z79899 Other long term (current) drug therapy: Secondary | ICD-10-CM

## 2024-12-24 DIAGNOSIS — E782 Mixed hyperlipidemia: Secondary | ICD-10-CM

## 2024-12-24 DIAGNOSIS — I10 Essential (primary) hypertension: Secondary | ICD-10-CM

## 2024-12-24 NOTE — Patient Instructions (Signed)
 Medication Instructions:  Your physician recommends that you continue on your current medications as directed. Please refer to the Current Medication list given to you today.  *If you need a refill on your cardiac medications before your next appointment, please call your pharmacy*  Lab Work: Your physician recommends that you return for lab work in:   Labs today: BMP  If you have labs (blood work) drawn today and your tests are completely normal, you will receive your results only by: MyChart Message (if you have MyChart) OR A paper copy in the mail If you have any lab test that is abnormal or we need to change your treatment, we will call you to review the results.  Testing/Procedures:   Your cardiac CT will be scheduled at one of the below locations:   [facility] [address] [place], [place] [address] [phone] (Severe contrast allergies only)  OR   [facility] [address] [place], [place] [address] [phone]  OR   [facility] [address] [place], [place] [address] [phone]  OR   [facility] and Vascular Tower [address]  [place], [place] [address]  OR   [facility] [address] [place], [place] [phone]  If scheduled at [facility], please arrive at the [facility] and Children's Entrance (Entrance C2) of [facility] 30 minutes prior to test start time. You can use the FREE valet parking offered at entrance C (encouraged to control the heart rate for the test)  Proceed to the [facility] Radiology Department (first floor) to check-in and test prep.  All radiology patients and guests should use entrance C2 at [facility], accessed from [facility], even though the hospital's physical address listed is [address]  If scheduled at the Heart and Vascular Tower at [address], please enter the parking lot using the  [address] entrance and use the FREE valet service at the patient drop-off area. Enter the building and check-in with registration on the main floor.  If scheduled at [facility], please arrive to the Heart and Vascular Center 15 mins early for check-in and test prep.  There is spacious parking and easy access to the radiology department from the [facility] Heart and Vascular entrance. Please enter here and check-in with the desk attendant.   If scheduled at [facility], please arrive 30 minutes early for check-in and test prep.  Please follow these instructions carefully (unless otherwise directed):  An IV will be required for this test and Nitroglycerin will be given.  Hold all erectile dysfunction medications at least 3 days (72 hrs) prior to test. (Ie viagra, cialis, sildenafil, tadalafil, etc)   On the Night Before the Test: Be sure to Drink plenty of water. Do not consume any caffeinated/decaffeinated beverages or chocolate 12 hours prior to your test. Do not take any antihistamines 12 hours prior to your test.  On the Day of the Test: Drink plenty of water until 1 hour prior to the test. Do not eat any food 1 hour prior to test. You may take your regular medications prior to the test.  Patients who wear a continuous glucose monitor MUST remove the device prior to scanning.      After the Test: Drink plenty of water. After receiving IV contrast, you may experience a mild flushed feeling. This is normal. On occasion, you may experience a mild rash  up to 24 hours after the test. This is not dangerous. If this occurs, you can take Benadryl 25 mg, Zyrtec, Claritin, or Allegra and increase your fluid intake. (Patients taking Tikosyn should avoid Benadryl, and may take Zyrtec, Claritin, or Allegra) If you experience trouble breathing, this can be serious. If it is severe call 911 IMMEDIATELY. If it is mild, please call our office.  We will call to schedule  your test 2-4 weeks out understanding that some insurance companies will need an authorization prior to the service being performed.   For more information and frequently asked questions, please visit our website : [email or web address]  For non-scheduling related questions, please contact the cardiac imaging nurse navigator should you have any questions/concerns: Cardiac Imaging Nurse Navigators Direct Office Dial: [phone]   For scheduling needs, including cancellations and rescheduling, please call Brittany, [phone].  For billing questions, please call [phone].    Follow-Up: At [facility], you and your health needs are our priority.  As part of our continuing mission to provide you with exceptional heart care, our providers are all part of one team.  This team includes your primary Cardiologist (physician) and Advanced Practice Providers or APPs (Physician Assistants and Nurse Practitioners) who all work together to provide you with the care you need, when you need it.  Your next appointment:   6 month(s)  Provider:   Redell Leiter, MD    We recommend signing up for the patient portal called MyChart.  Sign up information is provided on this After Visit Summary.  MyChart is used to connect with patients for Virtual Visits (Telemedicine).  Patients are able to view lab/test results, encounter notes, upcoming appointments, etc.  Non-urgent messages can be sent to your provider as well.   To learn more about what you can do with MyChart, go to [email or web address].   Other Instructions None
